# Patient Record
Sex: Female | Born: 1970 | Race: Black or African American | Hispanic: No | Marital: Single | Smoking: Current every day smoker
Health system: Southern US, Community
[De-identification: ages and names within clinical notes are randomized; demographics above are authoritative.]

## PROBLEM LIST (undated history)

## (undated) DIAGNOSIS — M1990 Unspecified osteoarthritis, unspecified site: Secondary | ICD-10-CM

## (undated) DIAGNOSIS — I10 Essential (primary) hypertension: Secondary | ICD-10-CM

## (undated) HISTORY — PX: ABDOMINAL HYSTERECTOMY: SHX81

## (undated) HISTORY — PX: CHOLECYSTECTOMY: SHX55

## (undated) HISTORY — PX: BREAST SURGERY: SHX581

---

## 2015-10-11 ENCOUNTER — Emergency Department (HOSPITAL_COMMUNITY)
Admission: EM | Admit: 2015-10-11 | Discharge: 2015-10-11 | Disposition: A | Discharge status: Home / Self Care | Payer: Self-pay | Attending: Emergency Medicine | Admitting: Emergency Medicine

## 2015-10-11 ENCOUNTER — Emergency Department (HOSPITAL_COMMUNITY): Payer: Self-pay

## 2015-10-11 ENCOUNTER — Encounter (HOSPITAL_COMMUNITY): Payer: Self-pay | Admitting: Family Medicine

## 2015-10-11 DIAGNOSIS — I10 Essential (primary) hypertension: Secondary | ICD-10-CM | POA: Insufficient documentation

## 2015-10-11 DIAGNOSIS — M25561 Pain in right knee: Secondary | ICD-10-CM | POA: Insufficient documentation

## 2015-10-11 DIAGNOSIS — Z79899 Other long term (current) drug therapy: Secondary | ICD-10-CM | POA: Insufficient documentation

## 2015-10-11 DIAGNOSIS — F17200 Nicotine dependence, unspecified, uncomplicated: Secondary | ICD-10-CM | POA: Insufficient documentation

## 2015-10-11 HISTORY — DX: Essential (primary) hypertension: I10

## 2015-10-11 LAB — I-STAT CHEM 8, ED
BUN: 5 mg/dL — AB (ref 6–20)
CALCIUM ION: 1.18 mmol/L (ref 1.12–1.23)
CHLORIDE: 102 mmol/L (ref 101–111)
CREATININE: 0.8 mg/dL (ref 0.44–1.00)
Glucose, Bld: 91 mg/dL (ref 65–99)
HCT: 45 % (ref 36.0–46.0)
Hemoglobin: 15.3 g/dL — ABNORMAL HIGH (ref 12.0–15.0)
Potassium: 3.9 mmol/L (ref 3.5–5.1)
SODIUM: 141 mmol/L (ref 135–145)
TCO2: 28 mmol/L (ref 0–100)

## 2015-10-11 MED ORDER — HYDROCODONE-ACETAMINOPHEN 5-325 MG PO TABS
1.0000 | ORAL_TABLET | Freq: Once | ORAL | Status: AC
  Administered 2015-10-11: 1 via ORAL
  Filled 2015-10-11: qty 1

## 2015-10-11 MED ORDER — HYDROCODONE-ACETAMINOPHEN 5-325 MG PO TABS: 2.0000 | ORAL_TABLET | ORAL | Status: DC | PRN

## 2015-10-11 MED ORDER — HYDROCHLOROTHIAZIDE 12.5 MG PO TABS: 12.5000 mg | ORAL_TABLET | Freq: Every day | ORAL | Status: DC

## 2015-10-11 MED ORDER — IBUPROFEN 600 MG PO TABS: 600.0000 mg | ORAL_TABLET | Freq: Four times a day (QID) | ORAL | Status: DC | PRN

## 2015-10-11 NOTE — ED Provider Notes (Signed)
CSN: [id number]     Arrival date & time 10/11/15  0847 History  By signing my name below, I, Evon Slack, attest that this documentation has been prepared under the direction and in the presence of [employer], [place]. Electronically Signed: Evon Slack, ED Scribe. 10/11/2015. 9:45 AM.      Chief Complaint  Patient presents with  . Knee Pain    The history is provided by the patient. No language interpreter was used.   HPI Comments: Brandy Holmes is a 45 y.o. female who presents to the Emergency Department complaining of constant worsening sharp right knee pain onset 1 week prior. Reports associated swelling. She states that the pain intermittently radiates up into her thigh. She states that the pain is worse when bearing weight and ambulating. Pt denies taking any medications PTA. Pt denies recent injury or trauma to the knee.  Denies fever or numbness. Pt does report that she has Hx of HTN and has not been compliant with her medications for the last 2 years. Denies HA , light headedness, dizziness, change in vision, CP, SOB, numbness, tingling, weakness.  Past Medical History  Diagnosis Date  . Hypertension    Past Surgical History  Procedure Laterality Date  . Abdominal hysterectomy    . Breast surgery    . Cholecystectomy     History reviewed. No pertinent family history. Social History  Substance Use Topics  . Smoking status: Current Every Day Smoker  . Smokeless tobacco: None  . Alcohol Use: No   OB History    No data available      Review of Systems  Eyes: Negative for visual disturbance.  Musculoskeletal: Positive for joint swelling and arthralgias.  Neurological: Negative for dizziness, light-headedness, numbness and headaches.     Allergies  Review of patient's allergies indicates no known allergies.  Home Medications   Prior to Admission medications   Medication Sig Start Date End Date Taking? Authorizing Provider  hydrochlorothiazide  (HYDRODIURIL) 12.5 MG tablet Take 1 tablet (12.5 mg total) by mouth daily. 10/11/15   Barrett Henle, PA-C  HYDROcodone-acetaminophen (NORCO/VICODIN) 5-325 MG tablet Take 2 tablets by mouth every 4 (four) hours as needed. 10/11/15   Barrett Henle, PA-C  ibuprofen (ADVIL,MOTRIN) 600 MG tablet Take 1 tablet (600 mg total) by mouth every 6 (six) hours as needed. 10/11/15   Satira Sark Meena Barrantes, PA-C   BP 133/69 mmHg  Pulse 50  Temp(Src) 98.3 F (36.8 C) (Oral)  Resp 16  SpO2 100%  LMP 10/11/2015   Physical Exam  Constitutional: She is oriented to person, place, and time. She appears well-developed and well-nourished.  HENT:  Head: Normocephalic and atraumatic.  Eyes: Conjunctivae and EOM are normal. Right eye exhibits no discharge. Left eye exhibits no discharge. No scleral icterus.  Neck: Normal range of motion. Neck supple.  Cardiovascular: Normal rate, regular rhythm and normal heart sounds.   Pulmonary/Chest: Effort normal. No respiratory distress. She has no wheezes. She has no rales. She exhibits no tenderness.  Abdominal: Soft. She exhibits no distension.  Musculoskeletal: She exhibits tenderness.       Right knee: She exhibits no swelling, no effusion, no ecchymosis, no deformity, no laceration, no erythema, normal alignment, no LCL laxity, normal patellar mobility and no MCL laxity. Decreased range of motion: due to pain. Tenderness found. Medial joint line, lateral joint line and patellar tendon tenderness noted.  Right knee with mild TTP, decreased active ROM due to pain, no MCL ACL PCL  laxity, 2+ PT pulse, sensation grossly intact, no swelling erythema or warmth noted.    Neurological: She is alert and oriented to person, place, and time.  Skin: Skin is warm and dry.  Nursing note and vitals reviewed.   ED Course  Procedures (including critical care time) DIAGNOSTIC STUDIES: Oxygen Saturation is 100% on RA, normal by my interpretation.    COORDINATION OF  CARE: 9:42 AM-Discussed treatment plan with pt at bedside and pt agreed to plan.     Labs Review Labs Reviewed  I-STAT CHEM 8, ED - Abnormal; Notable for the following:    BUN 5 (*)    Hemoglobin 15.3 (*)    All other components within normal limits    Imaging Review Dg Knee Complete 4 Views Right  10/11/2015  CLINICAL DATA:  Pain and swelling for 1 week. Twisting injury 2 weeks prior EXAM: RIGHT KNEE - COMPLETE 4+ VIEW COMPARISON:  None. FINDINGS: Frontal, lateral, and bilateral oblique views were obtained. There is no fracture or dislocation. There is no appreciable joint effusion. There is narrowing of the patellofemoral joint. Other joint spaces appear normal. No erosive change. There are spurs along the anterior patella. IMPRESSION: Narrowing patellofemoral joint. No fracture or dislocation. No joint effusion. Anterior patellar spurs are likely due to distal quadriceps and proximal patellar tendinosis. Electronically Signed   By: Bretta BangWilliam  Woodruff III M.D.   On: 10/11/2015 09:27   I have personally reviewed and evaluated these images as part of my medical decision-making.   EKG Interpretation None      MDM   Final diagnoses:  Right knee pain    Patient presents with right knee pain for the past week, worse with bearing weight or ambulating. Denies any recent fall, trauma, injury. Denies fever. BP 190/90, remaining vitals stable. Patient endorses history of hypertension but notes she has not been taking medications for the past 2 years. Exam revealed mild tenderness to right knee with decreased active range of motion due to pain. Exam otherwise unremarkable, right lower extremity neurovascularly intact. Patient given pain meds in the ED. Right knee x-ray revealed narrowing patellofemoral joint, anterior patellar spurs, no fracture or dislocation or joint effusion. I suspect patient's symptoms are likely due to arthritic changes seen on x-ray. On reevaluation, patient reports her  pain has improved. Repeat vitals revealed BP 197/87. Patient denies headache, visual changes, lightheadedness, dizziness, shortness of breath, chest pain, abdominal pain, numbness, tingling, weakness. No signs of hypertensive emergency or urgency at this time. Due to patient's elevated blood pressure, will check basic labs and start patient on HCTZ with PCP follow-up. Labs unremarkable. Plan to discharge patient home with pain meds and low-dose HCTZ. Discussed results and plan for discharge with patient. Advised patient to follow up with a PCP within the next week for follow-up of her right knee pain and further management of her hypertension. Discussed return precautions with patient.  I personally performed the services described in this documentation, which was scribed in my presence. The recorded information has been reviewed and is accurate.      Satira Sarkicole Elizabeth Jamest[place]Nadeau, [place]-C 10/11/15 1129  Pricilla LovelessScott Goldston, MD 10/17/15 [phone]

## 2015-10-11 NOTE — Discharge Instructions (Signed)
Take your medications as prescribed as needed for pain relief. I recommend resting, elevating and applying ice to her knee for 15-20 minutes 3-4 times daily to help with pain and swelling. Please follow up with a primary care provider from the Resource Guide provided below in 1 week if your symptoms have not improved. I also recommend following up with a primary care provider within the next week regarding reevaluation of your blood pressure and further management of your elevated blood pressure. Please return to the Emergency Department if symptoms worsen or new onset of fever, redness, swelling, warmth, numbness, tingling, weakness, chest pain, shortness of breath, headache, visual changes, lightheadedness, dizziness.

## 2015-10-11 NOTE — ED Notes (Signed)
Pt is in stable condition upon d/c and ambulates from ED. Pt started on HCTZ rt HTN, pt hasn't been taking her prescribed lisinopril in years. Pt verbalizes understanding about medication and s/s of HTN.

## 2015-10-11 NOTE — ED Notes (Signed)
Pt here for right knee pain x 1 week and throbbing. denies injury. sts hx of same in knee.

## 2015-10-11 NOTE — ED Notes (Signed)
Patient transported to X-ray 

## 2015-10-14 ENCOUNTER — Emergency Department (HOSPITAL_COMMUNITY)
Admission: EM | Admit: 2015-10-14 | Discharge: 2015-10-14 | Disposition: A | Discharge status: Home / Self Care | Payer: Self-pay | Attending: Emergency Medicine | Admitting: Emergency Medicine

## 2015-10-14 ENCOUNTER — Encounter (HOSPITAL_COMMUNITY): Payer: Self-pay | Admitting: Emergency Medicine

## 2015-10-14 DIAGNOSIS — I10 Essential (primary) hypertension: Secondary | ICD-10-CM | POA: Insufficient documentation

## 2015-10-14 DIAGNOSIS — F17200 Nicotine dependence, unspecified, uncomplicated: Secondary | ICD-10-CM | POA: Insufficient documentation

## 2015-10-14 DIAGNOSIS — R0600 Dyspnea, unspecified: Secondary | ICD-10-CM | POA: Insufficient documentation

## 2015-10-14 DIAGNOSIS — M25561 Pain in right knee: Secondary | ICD-10-CM | POA: Insufficient documentation

## 2015-10-14 NOTE — Discharge Instructions (Signed)
Immobilizer for comfort. Since having trouble with the price of the prescriptions would recommend over-the-counter Aleve Naprosyn 500 mg twice a day for the next 7 days. Would be important if you could at least afford the hydrochlorothiazide prescription. Follow-up with orthopedics. Work note provided.

## 2015-10-14 NOTE — ED Notes (Signed)
Pt arrives from home with cc of R knee pain. Pt says she was seen Wednesday for same, has been resting and icing leg and wasn't able to get Rx's at d/c due to cost. Pt was at bus stop yesterday, stepped down on sidewalk and heard knee pop. C/o "12/10" pain currently, worse with bending.

## 2015-10-14 NOTE — ED Provider Notes (Signed)
CSN: [id number]     Arrival date & time 10/14/15  0919 History   First MD Initiated Contact with Patient 10/14/15 [id number]     Chief Complaint  Patient presents with  . Knee Pain  . Leg Pain     (Consider location/radiation/quality/duration/timing/severity/associated sxs/prior Treatment) Patient is a 45 y.o. female presenting with knee pain and leg pain. The history is provided by the patient.  Knee Pain Associated symptoms: no fever   Leg Pain Associated symptoms: no fever   Patient with two-week history of knee pain. Patient's had some trouble with the knee on and off in the past. Has had popping sensation followed by pain. Patient seen June 14 for same x-rays without any acute bony injuries with some suggestion of some arthritic knee changes. Patient unable to get any prescriptions filled due to cost. Patient returns for same complaint seen on June 14. No new injury. Patient still with persistent right knee pain particularly with bending. Patient states knee pain is 10 out of 10.  Past Medical History  Diagnosis Date  . Hypertension    Past Surgical History  Procedure Laterality Date  . Abdominal hysterectomy    . Breast surgery    . Cholecystectomy     No family history on file. Social History  Substance Use Topics  . Smoking status: Current Every Day Smoker  . Smokeless tobacco: None  . Alcohol Use: No   OB History    No data available     Review of Systems  Constitutional: Negative for fever.  Respiratory: Negative for shortness of breath.   Cardiovascular: Negative for chest pain.  Gastrointestinal: Negative for abdominal pain.  Musculoskeletal: Positive for joint swelling.  Skin: Negative for rash and wound.  Neurological: Positive for weakness. Negative for numbness and headaches.  Hematological: Does not bruise/bleed easily.  Psychiatric/Behavioral: Negative for confusion.      Allergies  Review of patient's allergies indicates no known allergies.  Home  Medications   Prior to Admission medications   Medication Sig Start Date End Date Taking? Authorizing Provider  hydrochlorothiazide (HYDRODIURIL) 12.5 MG tablet Take 1 tablet (12.5 mg total) by mouth daily. 10/11/15   Barrett HenleNicole Elizabeth Nadeau, PA-C  HYDROcodone-acetaminophen (NORCO/VICODIN) 5-325 MG tablet Take 2 tablets by mouth every 4 (four) hours as needed. 10/11/15   Barrett HenleNicole Elizabeth Nadeau, PA-C  ibuprofen (ADVIL,MOTRIN) 600 MG tablet Take 1 tablet (600 mg total) by mouth every 6 (six) hours as needed. 10/11/15   Satira SarkNicole Elizabeth Nadeau, PA-C   BP 207/63 mmHg  Pulse 47  SpO2 100%  LMP 10/11/2015 Physical Exam  Constitutional: She is oriented to person, place, and time. She appears well-developed and well-nourished. No distress.  HENT:  Head: Normocephalic and atraumatic.  Mouth/Throat: Oropharynx is clear and moist.  Eyes: Conjunctivae and EOM are normal. Pupils are equal, round, and reactive to light.  Neck: Normal range of motion. Neck supple.  Cardiovascular: Normal rate, regular rhythm and normal heart sounds.   No murmur heard. Pulmonary/Chest: Breath sounds normal. She is in respiratory distress.  Abdominal: Soft. Bowel sounds are normal. There is no tenderness.  Musculoskeletal: She exhibits edema.  Slight swelling to right knee suggestive of mild effusion. Patella is not dislocated. No joint line tenderness. No locking but decreased range of motion associated with pain. Neurovascularly intact distally. No calf tenderness. No erythema. No increased warmth.  Neurological: She is alert and oriented to person, place, and time. No cranial nerve deficit. She exhibits normal muscle tone. Coordination normal.  Skin: Skin is warm. No rash noted.  Nursing note and vitals reviewed.   ED Course  Procedures (including critical care time) Labs Review Labs Reviewed - No data to display  Imaging Review No results found. I have personally reviewed and evaluated these images and lab  results as part of my medical decision-making.   EKG Interpretation None      MDM   Final diagnoses:  Knee pain, acute, right    Patient with two-week complaint of right knee pain. Experience some popping in his had pain since then. Patient seen on June 14 x-rays without any bony injuries but some arthritic type changes. Patient's exam today shows suggestion of a slight effusion to the right knee compared to left. Neurovascularly intact. Will treat with knee immobilizer orthopedic referral. Patient did not get any of her prescriptions filled due to cost. Would recommend over-the-counter Naprosyn and also recommend she try to get the hydrochlorothiazide filled for the high blood pressure. Patient's work note extended.     Vanetta Mulders, MD 10/14/15 1011

## 2016-03-19 ENCOUNTER — Encounter: Payer: Self-pay | Admitting: Pediatric Intensive Care

## 2016-03-26 ENCOUNTER — Encounter: Payer: Self-pay | Admitting: Pediatric Intensive Care

## 2016-04-04 NOTE — Congregational Nurse Program (Signed)
Congregational Nurse Program Note  Date of Encounter: 03/19/2016  Past Medical History: Past Medical History:  Diagnosis Date  . Hypertension     Encounter Details:     CNP Questionnaire - 03/19/16 0830      Patient Demographics   Is this a new or existing patient? New   Patient is considered a/an Not Applicable   Race African-American/Black     Patient Assistance   Location of Patient Assistance [facility]   Patient's financial/insurance status Self-Pay (Uninsured)   Uninsured Patient ([employer] officer, trade unionCard/Care Connects) Yes   Interventions Not Applicable   Patient referred to apply for the following financial assistance [facility]al   Food insecurities addressed Not Applicable   Transportation assistance No   Assistance securing medications No   Product/process development scientistducational health offerings Navigating the healthcare system;Hypertension     Encounter Details   Primary purpose of visit Navigating the Healthcare System;Education/Health Concerns   Was an Emergency Department visit averted? Not Applicable   Does patient have a medical provider? No   Patient referred to Establish PCP   Was a mental health screening completed? (GAINS tool) No   Does patient have dental issues? No   Does patient have vision issues? No   Does your patient have an abnormal blood pressure today? No   Since previous encounter, have you referred patient for abnormal blood pressure that resulted in a new diagnosis or medication change? No   Does your patient have an abnormal blood glucose today? No   Since previous encounter, have you referred patient for abnormal blood glucose that resulted in a new diagnosis or medication change? No   Was there a life-saving intervention made? No     Client new to [facility]. She had Medicaid previously in another state. Reports history of hypertension, left knee and shoulder arthtritis and pain issues. No medications at present but has taken gabapentin, naproxen and amlodipine in  the past. CN [place] and [facility]. Client states she will [facility]

## 2016-04-06 NOTE — Congregational Nurse Program (Signed)
Congregational Nurse Program Note  Date of Encounter: 03/26/2016  Past Medical History: Past Medical History:  Diagnosis Date  . Hypertension     Encounter Details:     CNP Questionnaire - 03/26/16 1000      Patient Demographics   Is this a new or existing patient? Existing   Patient is considered a/an Not Applicable   Race African-American/Black     Patient Assistance   Location of Patient Assistance [facility]   Patient's financial/insurance status Self-Pay (Uninsured)   Uninsured Patient ([employer] Card/Care Connects) Yes   Interventions Counseled to make appt. with provider;Assisted patient in making appt.   Patient referred to apply for the following financial assistance Medicaid;[employer] Card/Care Connects   Food insecurities addressed Not Applicable   Transportation assistance No   Assistance securing medications No   Educational health offerings Hypertension;Navigating the healthcare system     Encounter Details   Primary purpose of visit Navigating the Healthcare System;Education/Health Concerns   Was an Emergency Department visit averted? Not Applicable   Does patient have a medical provider? No   Patient referred to Establish PCP;Clinic   Was a mental health screening completed? (GAINS tool) No   Does patient have dental issues? No   Does patient have vision issues? No   Does your patient have an abnormal blood pressure today? No   Since previous encounter, have you referred patient for abnormal blood pressure that resulted in a new diagnosis or medication change? No   Does your patient have an abnormal blood glucose today? No   Since previous encounter, have you referred patient for abnormal blood glucose that resulted in a new diagnosis or medication change? No   Was there a life-saving intervention made? No      BP check. Client [facility] due to [facility]. CN attempted to contact client's old provider in [place] but they were  unable to renew prescription for BP meds. CN will continue to assist client in making PCP appointment.

## 2016-04-08 ENCOUNTER — Encounter (HOSPITAL_COMMUNITY): Payer: Self-pay

## 2016-04-08 ENCOUNTER — Emergency Department (HOSPITAL_COMMUNITY)
Admission: EM | Admit: 2016-04-08 | Discharge: 2016-04-08 | Disposition: A | Discharge status: Home / Self Care | Payer: Self-pay | Attending: Emergency Medicine | Admitting: Emergency Medicine

## 2016-04-08 DIAGNOSIS — G8929 Other chronic pain: Secondary | ICD-10-CM | POA: Insufficient documentation

## 2016-04-08 DIAGNOSIS — F17200 Nicotine dependence, unspecified, uncomplicated: Secondary | ICD-10-CM | POA: Insufficient documentation

## 2016-04-08 DIAGNOSIS — I10 Essential (primary) hypertension: Secondary | ICD-10-CM | POA: Insufficient documentation

## 2016-04-08 DIAGNOSIS — M25561 Pain in right knee: Secondary | ICD-10-CM | POA: Insufficient documentation

## 2016-04-08 DIAGNOSIS — Z79899 Other long term (current) drug therapy: Secondary | ICD-10-CM | POA: Insufficient documentation

## 2016-04-08 MED ORDER — TRAMADOL-ACETAMINOPHEN 37.5-325 MG PO TABS: 1.0000 | ORAL_TABLET | Freq: Four times a day (QID) | ORAL | 0 refills | Status: DC | PRN

## 2016-04-08 NOTE — ED Provider Notes (Signed)
[facility]EMERGENCY DEPT Provider Note   CSN: [id number] Arrival date & time: 04/08/16  1451  By signing my name below, I, Brandy Holmes, attest that this documentation has been prepared under the direction and in the presence of Terance HartKelly Amarilis Belflower, PA-C. Electronically Signed: Javier Dockerobert Ryan Halas, ER Scribe. 12/09/2015. 3:49 PM.  History   Chief Complaint Chief Complaint  Patient presents with  . Joint Swelling   HPI  HPI Comments: Brandy SilvanDesiree Holmes is a 45 y.o. female who presents to the Emergency Department complaining of right knee pain and swelling, worse with walking or standing for extended periods. She has a past hx of several injuries to the knee over the past 20 years. Going up stairs hurts more than going down stairs. She states her right knee occasionally pops and gives out on her when she walks. She has not seen an orthopedic doctor in the past. She has taken ibuprofen and aleve with temporary relief. She is taking 600mg  twice per day.    Past Medical History:  Diagnosis Date  . Hypertension     There are no active problems to display for this patient.   Past Surgical History:  Procedure Laterality Date  . ABDOMINAL HYSTERECTOMY    . BREAST SURGERY    . CHOLECYSTECTOMY      OB History    No data available       Home Medications    Prior to Admission medications   Medication Sig Start Date End Date Taking? Authorizing Provider  hydrochlorothiazide (HYDRODIURIL) 12.5 MG tablet Take 1 tablet (12.5 mg total) by mouth daily. 10/11/15   Barrett HenleNicole Elizabeth Nadeau, PA-C  HYDROcodone-acetaminophen (NORCO/VICODIN) 5-325 MG tablet Take 2 tablets by mouth every 4 (four) hours as needed. 10/11/15   Barrett HenleNicole Elizabeth Nadeau, PA-C  ibuprofen (ADVIL,MOTRIN) 600 MG tablet Take 1 tablet (600 mg total) by mouth every 6 (six) hours as needed. 10/11/15   Barrett HenleNicole Elizabeth Nadeau, PA-C    Family History No family history on file.  Social History Social History  Substance Use Topics  . Smoking  status: Current Every Day Smoker  . Smokeless tobacco: Never Used  . Alcohol use No     Allergies   Patient has no known allergies.   Review of Systems Review of Systems  Constitutional: Negative for chills and fever.  Musculoskeletal: Positive for arthralgias and gait problem.  Skin: Negative for color change and wound.  Neurological: Positive for weakness. Negative for numbness.   Physical Exam Updated Vital Signs BP 160/79 (BP Location: Right Arm)   Pulse 72   Temp 98 F (36.7 C) (Oral)   Resp 18   Ht 5\' 5"  (1.651 m)   Wt 230 lb (104.3 kg)   SpO2 99%   BMI 38.27 kg/m   Physical Exam  Constitutional: She is oriented to person, place, and time. She appears well-developed and well-nourished. No distress.  HENT:  Head: Normocephalic and atraumatic.  Eyes: Pupils are equal, round, and reactive to light.  Neck: Neck supple.  Cardiovascular: Normal rate.   Pulmonary/Chest: Effort normal. No respiratory distress.  Musculoskeletal: Normal range of motion.  Right knee: No obvious swelling or deformity. Tenderness to palpation along medial joint line. Decreased ROM. N/V intact.   Neurological: She is alert and oriented to person, place, and time. Coordination normal.  Skin: Skin is warm and dry. She is not diaphoretic.  Psychiatric: She has a normal mood and affect. Her behavior is normal.  Nursing note and vitals reviewed.  ED Treatments / Results  Labs (all labs ordered are listed, but only abnormal results are displayed) Labs Reviewed - No data to display  EKG  EKG Interpretation None       Radiology No results found.  Procedures Procedures (including critical care time)  Medications Ordered in ED Medications - No data to display   Initial Impression / Assessment and Plan / ED Course  I have reviewed the triage vital signs and the nursing notes.  Pertinent labs & imaging results that were available during my care of the patient were reviewed by me  and considered in my medical decision making (see chart for details).  Clinical Course    Pt present with aching pain & joint stiffness worsened in the morning. On exam joints were tender with limited ROM, but no current medical concern for septic arthritis or joint as pt is afebrile & without warmth of the affected area. Previous xrays reviewed and are consistent with OA. She may have underlying soft tissue injury as well with recurrent popping and her leg giving out. Pt advised to follow up with orthopedics for further management. Return precautions discussed. Patient will be dc home & is agreeable with above plan.  Final Clinical Impressions(s) / ED Diagnoses   Final diagnoses:  Chronic pain of right knee    New Prescriptions Discharge Medication List as of 04/08/2016  4:05 PM    START taking these medications   Details  traMADol-acetaminophen (ULTRACET) 37.5-325 MG tablet Take 1 tablet by mouth every 6 (six) hours as needed., Starting Mon 04/08/2016, Print        I personally performed the services described in this documentation, which was scribed in my presence. The recorded information has been reviewed and is accurate.       Bethel BornKelly Marie Kaylah Chiasson, PA-C 04/13/16 1107    Loren Raceravid Yelverton, MD 04/13/16 [phone]

## 2016-04-08 NOTE — Discharge Instructions (Signed)
Rest - please stay off knee as much as possible Ice - ice for 20 minutes at a time, several times a day Compression - wear brace to provide support Elevate - elevate knee above level of heart Ibuprofen - take with food. Take up to 3-4 times daily Follow up with Orthopedics for definitive care

## 2016-04-08 NOTE — ED Notes (Signed)
Declined W/C at D/C and was escorted to lobby by RN. 

## 2016-04-08 NOTE — ED Notes (Signed)
Patient currently changing into gown.

## 2016-04-08 NOTE — ED Triage Notes (Signed)
Per Pt, Pt is coming from home with complaints of recurrent right knee burning with edema that started again this morning. Pt is able to walk, but reports knee "giving out" at times. Hx of "popping" and swelling since June.

## 2016-04-11 ENCOUNTER — Ambulatory Visit (INDEPENDENT_AMBULATORY_CARE_PROVIDER_SITE_OTHER): Payer: Self-pay | Admitting: Orthopedic Surgery

## 2016-04-11 ENCOUNTER — Ambulatory Visit (INDEPENDENT_AMBULATORY_CARE_PROVIDER_SITE_OTHER): Payer: Self-pay

## 2016-04-11 VITALS — Ht 65.0 in | Wt 230.0 lb

## 2016-04-11 DIAGNOSIS — G8929 Other chronic pain: Secondary | ICD-10-CM

## 2016-04-11 DIAGNOSIS — M25561 Pain in right knee: Secondary | ICD-10-CM

## 2016-04-11 DIAGNOSIS — M1711 Unilateral primary osteoarthritis, right knee: Secondary | ICD-10-CM

## 2016-04-11 MED ORDER — LIDOCAINE HCL 1 % IJ SOLN
5.0000 mL | INTRAMUSCULAR | Status: AC | PRN
  Administered 2016-04-11: 5 mL

## 2016-04-11 MED ORDER — METHYLPREDNISOLONE ACETATE 40 MG/ML IJ SUSP
40.0000 mg | INTRAMUSCULAR | Status: AC | PRN
  Administered 2016-04-11: 40 mg via INTRA_ARTICULAR
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
## 2016-04-16 ENCOUNTER — Encounter: Payer: Self-pay | Admitting: Pediatric Intensive Care

## 2016-04-24 NOTE — Congregational Nurse Program (Signed)
Congregational Nurse Program Note  Date of Encounter: 04/10/2016  Past Medical History: Past Medical History:  Diagnosis Date  . Hypertension     Encounter Details:     CNP Questionnaire - 04/10/16 1225      Patient Demographics   Is this a new or existing patient? New   Patient is considered a/an Not Applicable   Race Caucasian/White     Patient Assistance   Location of Patient Assistance Not Applicable   Patient's financial/insurance status Low Income;Medicaid   Uninsured Patient ([employer], trade unionCard/Care Connects) No   Interventions Not Applicable   Patient referred to apply for the following financial assistance Not Applicable   Food insecurities addressed Not Applicable   Transportation assistance Yes   Type of Assistance Bus Pass Given   Assistance securing medications No   Educational health offerings Chronic disease;Medications;Navigating the healthcare system     Encounter Details   Primary purpose of visit Education/Health Concerns;Chronic Illness/Condition Visit;Navigating the Healthcare System   Was an Emergency Department visit averted? Not Applicable   Does patient have a medical provider? Yes   Patient referred to Not Applicable   Was a mental health screening completed? (GAINS tool) No   Does patient have dental issues? No   Does patient have vision issues? No   Does your patient have an abnormal blood pressure today? No   Since previous encounter, have you referred patient for abnormal blood pressure that resulted in a new diagnosis or medication change? No   Does your patient have an abnormal blood glucose today? No   Since previous encounter, have you referred patient for abnormal blood glucose that resulted in a new diagnosis or medication change? No   Was there a life-saving intervention made? No     Requested assistance with medication fill from Ed.  Client has medicare.  Discussed with client that medicare would pay for scripts with a low co-pay.  Client  states she was unaware of that and could pay her co-pay of $3 or less.  Bus passes provided to go to the pharmacy to fill scripts

## 2016-04-30 ENCOUNTER — Encounter: Payer: Self-pay | Admitting: Pediatric Intensive Care

## 2016-04-30 ENCOUNTER — Emergency Department (HOSPITAL_COMMUNITY)
Admission: EM | Admit: 2016-04-30 | Discharge: 2016-04-30 | Disposition: A | Discharge status: Home / Self Care | Payer: Medicaid Other | Attending: Emergency Medicine | Admitting: Emergency Medicine

## 2016-04-30 ENCOUNTER — Encounter (HOSPITAL_COMMUNITY): Payer: Self-pay | Admitting: Emergency Medicine

## 2016-04-30 DIAGNOSIS — F17200 Nicotine dependence, unspecified, uncomplicated: Secondary | ICD-10-CM | POA: Insufficient documentation

## 2016-04-30 DIAGNOSIS — I10 Essential (primary) hypertension: Secondary | ICD-10-CM | POA: Insufficient documentation

## 2016-04-30 DIAGNOSIS — R0981 Nasal congestion: Secondary | ICD-10-CM

## 2016-04-30 MED ORDER — AMLODIPINE BESYLATE 2.5 MG PO TABS: 2.5000 mg | ORAL_TABLET | Freq: Every day | ORAL | 0 refills | Status: DC

## 2016-04-30 MED ORDER — AMLODIPINE BESYLATE 5 MG PO TABS
2.5000 mg | ORAL_TABLET | Freq: Once | ORAL | Status: AC
  Administered 2016-04-30: 2.5 mg via ORAL
  Filled 2016-04-30: qty 1

## 2016-04-30 MED FILL — AMLODIPINE BESYLATE 2.5 MG: 2.5 | 30 days supply | Qty: 30 | Fill #0

## 2016-04-30 NOTE — ED Notes (Signed)
Bed: WA13 Expected date:  Expected time:  Means of arrival:  Comments: 

## 2016-04-30 NOTE — ED Provider Notes (Signed)
[facility]-EMERGENCY DEPT Provider Note   CSN: [id number] Arrival date & time: 04/30/16  0854     History   Chief Complaint Chief Complaint  Patient presents with  . Hypertension    HPI Brandy Holmes is a 46 y.o. female with a PMHx of HTN and PSHx of hysterectomy and cholecystectomy, who presents to the ED sent in by the nursing staff at the shelter for evaluation and treatment of her hypertension. Patient states that she hasn't been on any hypertension medications in 2 years, was previously trialed on lisinopril which was not effective, lisinopril-HCTZ which caused her to have excessive urination at night, and ultimately amlodipine 2.5 mg daily which was effective and she tolerated it well. She hasn't had this medication in 2 years due to financial issues and not having a primary care doctor. The nursing staff at the shelter have been keeping an eye on her blood pressure and it has gradually elevated, so she was sent here to get refills of her blood pressure medications. Chart review reveals that on 10/11/15 she was in the emergency room for knee pain, her blood pressure was noted to be elevated, she was sent home with HCTZ 12.5 mg which she states she never filled due to financial concerns and the fact that she knows that medication will cause her to urinate more. She denies any complaints or concerns related to her hypertension today, although she does admit that occasionally she gets lightheaded and has palpitations when her blood pressure spikes. She currently has none of these symptoms.  Additionally she reports 1 week of sinus congestion, white rhinorrhea, and b/l ear pressure which worsens with being outside and with no tx tried PTA. She has flonase from a prior prescription but hasn't used it. Usually tries tylenol decongestants but hasn't taken these recently. She is working on getting PCP care at [facility], reports she's going to call them this afternoon.   She denies fevers, chills, headache,  vision changes, ongoing lightheadedness, cough, sore throat, ear drainage, leg swelling, CP, SOB, abd pain, N/V/D/C, hematuria, dysuria, myalgias, arthralgias, numbness, tingling, weakness, or any other complaints at this time.    The history is provided by the patient and medical records. No language interpreter was used.  Hypertension  This is a chronic problem. The current episode started more than 1 week ago. The problem occurs constantly. The problem has been gradually worsening. Pertinent negatives include no chest pain, no abdominal pain, no headaches and no shortness of breath. The symptoms are aggravated by stress. Nothing relieves the symptoms. She has tried nothing for the symptoms. The treatment provided no relief.    Past Medical History:  Diagnosis Date  . Hypertension     There are no active problems to display for this patient.   Past Surgical History:  Procedure Laterality Date  . ABDOMINAL HYSTERECTOMY    . BREAST SURGERY    . CHOLECYSTECTOMY      OB History    No data available       Home Medications    Prior to Admission medications   Medication Sig Start Date End Date Taking? Authorizing Provider  hydrochlorothiazide (HYDRODIURIL) 12.5 MG tablet Take 1 tablet (12.5 mg total) by mouth daily. 10/11/15   Barrett Henle, PA-C  HYDROcodone-acetaminophen (NORCO/VICODIN) 5-325 MG tablet Take 2 tablets by mouth every 4 (four) hours as needed. 10/11/15   Barrett Henle, PA-C  ibuprofen (ADVIL,MOTRIN) 600 MG tablet Take 1 tablet (600 mg total) by mouth every 6 (six)  hours as needed. 10/11/15   Barrett Henle, PA-C  traMADol-acetaminophen (ULTRACET) 37.5-325 MG tablet Take 1 tablet by mouth every 6 (six) hours as needed. 04/08/16   Bethel Born, PA-C    Family History No family history on file.  Social History Social History  Substance Use Topics  . Smoking status: Current Every Day Smoker  . Smokeless tobacco: Never Used  .  Alcohol use No     Allergies   Patient has no known allergies.   Review of Systems Review of Systems  Constitutional: Negative for chills and fever.  HENT: Positive for congestion, ear pain (pressure) and rhinorrhea. Negative for ear discharge and sore throat.   Eyes: Negative for visual disturbance.  Respiratory: Negative for cough and shortness of breath.   Cardiovascular: Negative for chest pain and leg swelling.  Gastrointestinal: Negative for abdominal pain, constipation, diarrhea, nausea and vomiting.  Genitourinary: Negative for dysuria, hematuria, vaginal bleeding and vaginal discharge.  Musculoskeletal: Negative for arthralgias and myalgias.  Skin: Negative for color change.  Allergic/Immunologic: Negative for immunocompromised state.  Neurological: Negative for weakness, light-headedness, numbness and headaches.  Psychiatric/Behavioral: Negative for confusion.   10 Systems reviewed and are negative for acute change except as noted in the HPI.   Physical Exam Updated Vital Signs BP 198/85 (BP Location: Left Arm)   Pulse 68   Temp 98.3 F (36.8 C) (Oral)   Resp 18   SpO2 100%   Physical Exam  Constitutional: She is oriented to person, place, and time. Vital signs are normal. She appears well-developed and well-nourished.  Non-toxic appearance. No distress.  Afebrile, nontoxic, NAD, HTN 198/95 noted, slightly higher than prior visits  HENT:  Head: Normocephalic and atraumatic.  Right Ear: Hearing, external ear and ear canal normal. Tympanic membrane is not erythematous and not bulging. A middle ear effusion (serous) is present.  Left Ear: Hearing, external ear and ear canal normal. Tympanic membrane is not erythematous and not bulging. A middle ear effusion (serous) is present.  Nose: Mucosal edema and rhinorrhea present. Right sinus exhibits maxillary sinus tenderness. Left sinus exhibits maxillary sinus tenderness.  Mouth/Throat: Uvula is midline, oropharynx is  clear and moist and mucous membranes are normal. No trismus in the jaw. No uvula swelling. Tonsils are 0 on the right. Tonsils are 0 on the left. No tonsillar exudate.  Ears with mild serous effusions bilaterally but otherwise clear bilaterally, TMs without erythema or bulging, good cone of light and landmarks maintained. Nose with mild mucosal edema and clear rhinorrhea, mild maxillary sinus TTP bilaterally. Oropharynx clear and moist, without uvular swelling or deviation, no trismus or drooling, no tonsillar swelling or erythema, no exudates.   Eyes: Conjunctivae and EOM are normal. Right eye exhibits no discharge. Left eye exhibits no discharge.  Neck: Normal range of motion. Neck supple.  Cardiovascular: Normal rate, regular rhythm, normal heart sounds and intact distal pulses.  Exam reveals no gallop and no friction rub.   No murmur heard. Pulmonary/Chest: Effort normal and breath sounds normal. No respiratory distress. She has no decreased breath sounds. She has no wheezes. She has no rhonchi. She has no rales.  Abdominal: Soft. Normal appearance and bowel sounds are normal. She exhibits no distension. There is no tenderness. There is no rigidity, no rebound, no guarding, no CVA tenderness, no tenderness at McBurney's point and negative Murphy's sign.  Musculoskeletal: Normal range of motion.  Neurological: She is alert and oriented to person, place, and time. She has normal strength.  No sensory deficit.  Skin: Skin is warm, dry and intact. No rash noted.  Psychiatric: She has a normal mood and affect.  Nursing note and vitals reviewed.    ED Treatments / Results  Labs (all labs ordered are listed, but only abnormal results are displayed) Labs Reviewed - No data to display  EKG  EKG Interpretation None       Radiology No results found.  Procedures Procedures (including critical care time)  Medications Ordered in ED Medications  amLODipine (NORVASC) tablet 2.5 mg (2.5 mg Oral  Given 04/30/16 [id number])     Initial Impression / Assessment and Plan / ED Course  I have reviewed the triage vital signs and the nursing notes.  Pertinent labs & imaging results that were available during my care of the patient were reviewed by me and considered in my medical decision making (see chart for details).  Clinical Course     46 y.o. female here with concern for HTN. She hasn't been on anything in 2 years for her BP, previously on lisinopril (not effective) then lisinopril-HCTZ (caused her to urinate a lot and she didn't like it), then on amlodipine 2.5mg  daily which was effective and she tolerated it well. Has been having BP monitored by nurse at shelter, and today it's been higher so she was sent here to get BP meds restarted. Also reports 1wk of sinus congestion/rhinorrhea/ear pressure. Mild nasal congestion and clear rhinorrhea noted. Denies other complaints at this time, no s/sx of HTN urgency/emergency, doubt need for further emergent work up at this time. Will restart amlodipine ($8.75 at [facility], vs HCTZ $4 at [facility]-- pt still prefers Amlodipine and states she can afford it). Discussed use of flonase and netipot and coricidin for her congestion, likely viral vs allergic vs HTN related; avoid other OTC decongestants. DASH diet encouraged. She's working on getting PCP care at [facility], advised to f/up with them to establish care and for ongoing management of her HTN. I explained the diagnosis and have given explicit precautions to return to the ER including for any other new or worsening symptoms. The patient understands and accepts the medical plan as it's been dictated and I have answered their questions. Discharge instructions concerning home care and prescriptions have been given. The patient is STABLE and is discharged to home in good condition.   Final Clinical Impressions(s) / ED Diagnoses   Final diagnoses:  Essential hypertension  Sinus congestion    New  Prescriptions New Prescriptions   AMLODIPINE (NORVASC) 2.5 MG TABLET    Take 1 tablet (2.5 mg total) by mouth daily.     Allen DerryMercedes Camprubi-Soms, PA-C 04/30/16 1013    Tilden FossaElizabeth Rees, MD 04/30/16 [phone]

## 2016-04-30 NOTE — Discharge Instructions (Signed)
Continue to stay well-hydrated. Continue to alternate between Tylenol and Ibuprofen for pain or fever. Use Mucinex for cough suppression/expectoration of mucus. Use netipot and flonase to help with nasal congestion. May use CORICIDIN for your sinus congestion, but avoid other over the counter decongestants as this may increase your blood pressure. You may consider over-the-counter Benadryl or other antihistamine to decrease secretions and for help with your symptoms. Eat a low salt diet, take your blood pressure medication as directed. Follow up with the primary care providers at [facility] in 5-7 days for recheck of ongoing symptoms and to establish medical care. Return to emergency department for emergent changing or worsening of symptoms.

## 2016-04-30 NOTE — ED Triage Notes (Signed)
Per pt, states HTN for a few days-states the nurse at shelter has been taking it-states she hasn't been on meds for awhile

## 2016-04-30 NOTE — Congregational Nurse Program (Signed)
Congregational Nurse Program Note  Date of Encounter: 04/30/2016  Past Medical History: Past Medical History:  Diagnosis Date  . Hypertension     Encounter Details:     CNP Questionnaire - 04/30/16 0836      Patient Demographics   Is this a new or existing patient? Existing   Patient is considered a/an Not Applicable   Race African-American/Black     Patient Assistance   Location of Patient Assistance [place]   Patient's financial/insurance status Low Income;Medicaid   Uninsured Patient ([employer], trade unionCard/Care Connects) No   Interventions Referred to ED/Urgent Care   Patient referred to apply for the following financial assistance Not Applicable   Food insecurities addressed Not Applicable   Transportation assistance Yes   Type of Assistance Bus Pass Given   Assistance securing medications No   Educational health offerings Hypertension     Encounter Details   Primary purpose of visit Acute Illness/Condition Visit;Education/Health Concerns   Was an Emergency Department visit averted? No   Does patient have a medical provider? No   Patient referred to Emergency Department   Was a mental health screening completed? (GAINS tool) No   Does patient have dental issues? No   Does patient have vision issues? No   Does your patient have an abnormal blood pressure today? Yes   Since previous encounter, have you referred patient for abnormal blood pressure that resulted in a new diagnosis or medication change? No   Does your patient have an abnormal blood glucose today? No   Since previous encounter, have you referred patient for abnormal blood glucose that resulted in a new diagnosis or medication change? No   Was there a life-saving intervention made? No     Client states that she doesn't feel well and that she knows her blood pressure is high. Client last smoked a cigarette about 0700. Client denies headache, floaters and chest pain but states that she feels like her heart is "erratic".  Heart regular 84-88 but BPs are elevated over client baseline. Buss passes given so client can go to ED for evaluation. Client to notify Cn if she needs prescriptions filled post-ED visit.

## 2016-05-03 ENCOUNTER — Ambulatory Visit (HOSPITAL_COMMUNITY)
Admission: EM | Admit: 2016-05-03 | Discharge: 2016-05-03 | Disposition: A | Discharge status: Home / Self Care | Payer: Medicaid Other | Attending: Emergency Medicine | Admitting: Emergency Medicine

## 2016-05-03 ENCOUNTER — Encounter: Payer: Self-pay | Admitting: Pediatric Intensive Care

## 2016-05-03 ENCOUNTER — Encounter (HOSPITAL_COMMUNITY): Payer: Self-pay | Admitting: Family Medicine

## 2016-05-03 DIAGNOSIS — M545 Low back pain, unspecified: Secondary | ICD-10-CM

## 2016-05-03 DIAGNOSIS — I159 Secondary hypertension, unspecified: Secondary | ICD-10-CM

## 2016-05-03 LAB — POCT I-STAT, CHEM 8
BUN: 8 mg/dL (ref 6–20)
CREATININE: 0.7 mg/dL (ref 0.44–1.00)
Calcium, Ion: 1.21 mmol/L (ref 1.15–1.40)
Chloride: 103 mmol/L (ref 101–111)
Glucose, Bld: 83 mg/dL (ref 65–99)
HCT: 40 % (ref 36.0–46.0)
Hemoglobin: 13.6 g/dL (ref 12.0–15.0)
Potassium: 3.7 mmol/L (ref 3.5–5.1)
Sodium: 139 mmol/L (ref 135–145)
TCO2: 26 mmol/L (ref 0–100)

## 2016-05-03 MED ORDER — IBUPROFEN 800 MG PO TABS: 800.0000 mg | ORAL_TABLET | Freq: Three times a day (TID) | ORAL | 0 refills | Status: DC

## 2016-05-03 MED ORDER — TRAMADOL HCL 50 MG PO TABS: ORAL_TABLET | ORAL | 0 refills | Status: DC

## 2016-05-03 MED ORDER — CYCLOBENZAPRINE HCL 10 MG PO TABS: 10.0000 mg | ORAL_TABLET | Freq: Three times a day (TID) | ORAL | 0 refills | Status: DC

## 2016-05-03 NOTE — ED Provider Notes (Signed)
HPI  SUBJECTIVE:  Brandy Holmes is a 46 y.o. female who presents from the [facility] for elevated blood pressure. Patient states that her blood pressure measured 198/100 there. Patient was restarted on her amlodipine 2.5 mg daily by the ED 2 days ago, the patient states that she is taking it. She denies any headaches, dysarthria, discoordination, arm or leg weakness, facial droop, chest pain, shortness of breath, abdominal pain, syncope, hematuria, anuria, lower extremity edema. She denies any recent decongestions or cocaine use. She also reports dull, achy, intermittent right-sided back pain for the past 2 days. States it is present depending on activity and that it is identical to previous back spasm/pain. She states that she had one episode of pain radiating down her leg, but none since. She states that she has been doing a lot of heavy lifting. No abdominal pain, fevers, syncope, urinary complaints, trauma to her back. No fecal or urinary retention, saddle anesthesia, lower extremity weakness. No coughing, wheezing, shortness of breath or nausea. She tried Naprosyn 2 tabs and stretching without improvement in her symptoms. Symptoms are worse with lateral bending. She has a past medical history of back pain, hypertension, cholecystectomy, UTIs. No history of nephrolithiasis, pyelonephritis, kidney disease, GI bleed, HIV, cancer, IVDU, AAA. She states that she is trying to arrange follow-up at [facility]. She also states the Flexeril has worked very well for her in the past.   She was seen in the ED on 1/2 for HTN. Per chart, Lisinopril was ineffective, lisinopril hydrochlorothiazide causes her to urinate a lot and patient did not like this. Amlodipine 2.5 mg daily was effective and she tolerated it well. She was given a prescription of this and advised to have DASH diet.   Past Medical History:  Diagnosis Date  . Hypertension     Past Surgical History:  Procedure Laterality Date  .  ABDOMINAL HYSTERECTOMY    . BREAST SURGERY    . CHOLECYSTECTOMY      History reviewed. No pertinent family history.  Social History  Substance Use Topics  . Smoking status: Current Every Day Smoker  . Smokeless tobacco: Never Used  . Alcohol use No    No current facility-administered medications for this encounter.   Current Outpatient Prescriptions:  .  amLODipine (NORVASC) 2.5 MG tablet, Take 1 tablet (2.5 mg total) by mouth daily., Disp: 30 tablet, Rfl: 0 .  cyclobenzaprine (FLEXERIL) 10 MG tablet, Take 1 tablet (10 mg total) by mouth 3 (three) times daily., Disp: 30 tablet, Rfl: 0 .  ibuprofen (ADVIL,MOTRIN) 800 MG tablet, Take 1 tablet (800 mg total) by mouth 3 (three) times daily., Disp: 30 tablet, Rfl: 0 .  traMADol (ULTRAM) 50 MG tablet, 1 tab po every 6 hours as needed Maximum dose= 8 tablets per day, Disp: 20 tablet, Rfl: 0  No Known Allergies   ROS  As noted in HPI.   Physical Exam  BP 159/69   Pulse (!) 52   Temp 98 F (36.7 C)   Resp 18   LMP 10/11/2015   SpO2 98%  BP Readings from Last 3 Encounters:  05/03/16 159/69  05/03/16 (!) 192/102  04/30/16 150/84   Constitutional: Well developed, well nourished, no acute distress Eyes:  EOMI, conjunctiva normal bilaterally HENT: Normocephalic, atraumatic,mucus membranes moist Respiratory: Normal inspiratory effort, , lungs clear bilaterally. Good air movement Cardiovascular: Regular rhythm, no murmurs, rubs, gallops GI: nondistended soft, nontender, active bowel sounds. No suprapubic or flank tenderness Back: No CVA tenderness skin:  No rash, skin intact Musculoskeletal: + R paralumbar tenderness, +  muscle spasm. + bony tenderness at L5/S1 but pt states that this is not new she states that this is been present since she had an epidural. Bilateral lower extremities NT, baseline ROM with intact PT pulses,  No pain with active or passive int/ext rotation flex/extension hips bilaterally. SLR neg bilaterally.  Sensation baseline light touch bilaterally for Pt, DTR's symmetric and intact bilaterally KJ. Motor symmetric bilateral 5/5 hip flexion, quadriceps, hamstrings, EHL, foot dorsiflexion, foot plantarflexion, gait normal.  Neurologic: Alert & oriented x 3, no focal neuro deficits Psychiatric: Speech and behavior appropriate   ED Course   Medications - No data to display  Orders Placed This Encounter  Procedures  . I-STAT, chem 8    Standing Status:   Standing    Number of Occurrences:   1    Results for orders placed or performed during the hospital encounter of 05/03/16 (from the past 24 hour(s))  I-STAT, chem 8     Status: None   Collection Time: 05/03/16  1:53 PM  Result Value Ref Range   Sodium 139 135 - 145 mmol/L   Potassium 3.7 3.5 - 5.1 mmol/L   Chloride 103 101 - 111 mmol/L   BUN 8 6 - 20 mg/dL   Creatinine, Ser 1.19 0.44 - 1.00 mg/dL   Glucose, Bld 83 65 - 99 mg/dL   Calcium, Ion 1.47 8.29 - 1.40 mmol/L   TCO2 26 0 - 100 mmol/L   Hemoglobin 13.6 12.0 - 15.0 g/dL   HCT 56.2 13.0 - 86.5 %   No results found.  ED Clinical Impression  Secondary hypertension  Acute right-sided low back pain without sciatica   ED Assessment/Plan  Previous records reviewed as noted in history of present illness.  [facility] narcotic database reviewed. Patient was prescribed a 3 day supply of tramadol #10 filled on 12/13.  I-STAT normal. No evidence of kidney disease or damage.  Initial BP was high, repeat BP more acceptable. She is otherwise having no complaints other than the back pain. Her back pain seems to be musculoskeletal in origin, we'll check kidney function prior to initiating NSAID treatment, but plan to send home with ibuprofen 800 mg 3 times a day with 1 g of Tylenol, Flexeril and tramadol. She will follow-up with her primary care physician at [facility] as soon as possible. She is to go to the ED if she gets worse or for any signs of hypertensive emergency.  Plan as  above.  Discussed labs, MDM, plan and followup with patient. Discussed sn/sx that should prompt return to the ED. Patient  agrees with plan.   Meds ordered this encounter  Medications  . traMADol (ULTRAM) 50 MG tablet    Sig: 1 tab po every 6 hours as needed Maximum dose= 8 tablets per day    Dispense:  20 tablet    Refill:  0  . ibuprofen (ADVIL,MOTRIN) 800 MG tablet    Sig: Take 1 tablet (800 mg total) by mouth 3 (three) times daily.    Dispense:  30 tablet    Refill:  0  . cyclobenzaprine (FLEXERIL) 10 MG tablet    Sig: Take 1 tablet (10 mg total) by mouth 3 (three) times daily.    Dispense:  30 tablet    Refill:  0    *This clinic note was created using Scientist, clinical (histocompatibility and immunogenetics). Therefore, there may be occasional mistakes despite careful proofreading.  ?  Domenick GongAshley Rudie Rikard, MD 05/03/16 1425

## 2016-05-03 NOTE — Congregational Nurse Program (Signed)
Congregational Nurse Program Note  Date of Encounter: 05/03/2016  Past Medical History: Past Medical History:  Diagnosis Date  . Hypertension     Encounter Details:     CNP Questionnaire - 05/03/16 [id number]      Patient Demographics   Is this a new or existing patient? Existing   Patient is considered a/an Not Applicable   Race African-American/Black     Patient Assistance   Location of Patient Assistance [facility]   Patient's financial/insurance status Medicaid   Uninsured Patient ([place] [facility]/[employer] Connects) No   Interventions Referred to ED/Urgent Care   Patient referred to apply for the following financial assistance Not Applicable   Food insecurities addressed Not Applicable   Transportation assistance Yes   Type of Assistance Bus Pass Given   Assistance securing medications No   Educational health offerings Hypertension     Encounter Details   Primary purpose of visit Acute Illness/Condition Visit   Was an Emergency Department visit averted? Yes   Does patient have a medical provider? Yes   Patient referred to Urgent Care   Was a mental health screening completed? (GAINS tool) No   Does patient have dental issues? No   Does patient have vision issues? No   Does your patient have an abnormal blood pressure today? Yes   Since previous encounter, have you referred patient for abnormal blood pressure that resulted in a new diagnosis or medication change? Yes   Does your patient have an abnormal blood glucose today? No   Since previous encounter, have you referred patient for abnormal blood glucose that resulted in a new diagnosis or medication change? No   Was there a life-saving intervention made? No     Clinet in for BP check after starting medication. Denies headache, floaters but states she's having back pain. BPs per flowsheet. Recommended client go to Urgent Care for BP assessment. Will follow up with CN for medication changes/BP re-check.

## 2016-05-03 NOTE — Discharge Instructions (Signed)
.   1 g of Tylenol with 800 mg of ibuprofen 3 times a day. Tramadol for severe pain only. Try some gentle stretching and deep tissue massage.   Decrease your salt intake. diet and exercise will lower your blood pressure significantly. It is important to keep your blood pressure under good control, as having a elevated for prolonged periods of time significantly increases your risk of stroke, heart attacks, kidney damage, eye damage, and other problems. Return here in a week for blood pressure recheck if you're able to find a primary care physician by then. Return immediately to the ER if you start having chest pain, headache, problems seeing, problems talking, problems walking, if you feel like you're about to pass out, if you do pass out, if you have a seizure, or for any other concerns..Marland Kitchen

## 2016-05-03 NOTE — ED Triage Notes (Addendum)
Pt here for increased BP. sts that is was 198/102. sts she recently started back on her BP meds. sts also some back cramping. Was seen this am by the congregational nurse.

## 2016-05-04 NOTE — Congregational Nurse Program (Signed)
Congregational Nurse Program Note  Date of Encounter: 04/16/2016  Past Medical History: Past Medical History:  Diagnosis Date  . Hypertension     Encounter Details:     CNP Questionnaire - 05/03/16 [id number]      Patient Demographics   Is this a new or existing patient? Existing   Patient is considered a/an Not Applicable   Race African-American/Black     Patient Assistance   Location of Patient Assistance [facility]   Patient's financial/insurance status Medicaid   Uninsured Patient ([place] [facility]/Care Connects) No   Interventions Referred to ED/Urgent Care   Patient referred to apply for the following financial assistance Not Applicable   Food insecurities addressed Not Applicable   Transportation assistance Yes   Type of Assistance Bus Pass Given   Assistance securing medications No   Educational health offerings Hypertension     Encounter Details   Primary purpose of visit Acute Illness/Condition Visit   Was an Emergency Department visit averted? Yes   Does patient have a medical provider? Yes   Patient referred to Urgent Care   Was a mental health screening completed? (GAINS tool) No   Does patient have dental issues? No   Does patient have vision issues? No   Does your patient have an abnormal blood pressure today? Yes   Since previous encounter, have you referred patient for abnormal blood pressure that resulted in a new diagnosis or medication change? Yes   Does your patient have an abnormal blood glucose today? No   Since previous encounter, have you referred patient for abnormal blood glucose that resulted in a new diagnosis or medication change? No   Was there a life-saving intervention made? No     BP check- client had ortho visit on 12/14 for cortisone shot in knee. BP continues elevated. Client has not established PCP for hypertension evaluation. Cleint states that she will go as soon as possible. Follow up for BP in CN clinic as needed.

## 2016-05-06 ENCOUNTER — Encounter: Payer: Self-pay | Admitting: Pediatric Intensive Care

## 2016-05-06 MED FILL — traMADol HCL 50 MG TABS: 50 | 7 days supply | Qty: 20 | Fill #0

## 2016-05-06 MED FILL — CYCLOBENZAPRINE 10 MG TAB: 10 | 10 days supply | Qty: 30 | Fill #0

## 2016-05-06 MED FILL — IBUPROFEN 800 MG TABLET: 800 | 10 days supply | Qty: 30 | Fill #0

## 2016-05-07 ENCOUNTER — Encounter: Payer: Self-pay | Admitting: Pediatric Intensive Care

## 2016-05-09 ENCOUNTER — Ambulatory Visit (INDEPENDENT_AMBULATORY_CARE_PROVIDER_SITE_OTHER): Payer: Medicaid Other | Admitting: Orthopedic Surgery

## 2016-05-12 NOTE — Congregational Nurse Program (Signed)
Congregational Nurse Program Note  Date of Encounter: 05/03/2016  Past Medical History: Past Medical History:  Diagnosis Date  . Hypertension     Encounter Details:     CNP Questionnaire - 05/03/16 [id number]      Patient Demographics   Is this a new or existing patient? Existing   Patient is considered a/an Not Applicable   Race African-American/Black     Patient Assistance   Location of Patient Assistance [facility]   Patient's financial/insurance status Medicaid   Uninsured Patient ([place] Card/Care Connects) No   Interventions Referred to ED/Urgent Care   Patient referred to apply for the following financial assistance Not Applicable   Food insecurities addressed Not Applicable   Transportation assistance Yes   Type of Assistance Bus Pass Given   Assistance securing medications No   Educational health offerings Hypertension     Encounter Details   Primary purpose of visit Acute Illness/Condition Visit   Was an Emergency Department visit averted? Yes   Does patient have a medical provider? Yes   Patient referred to Urgent Care   Was a mental health screening completed? (GAINS tool) No   Does patient have dental issues? No   Does patient have vision issues? No   Does your patient have an abnormal blood pressure today? Yes   Since previous encounter, have you referred patient for abnormal blood pressure that resulted in a new diagnosis or medication change? Yes   Does your patient have an abnormal blood glucose today? No   Since previous encounter, have you referred patient for abnormal blood glucose that resulted in a new diagnosis or medication change? No   Was there a life-saving intervention made? No     requested b/p check  160/90.  Has an appointment with primary provider soon.

## 2016-05-17 ENCOUNTER — Encounter: Payer: Self-pay | Admitting: Pediatric Intensive Care

## 2016-06-06 ENCOUNTER — Encounter (HOSPITAL_COMMUNITY): Payer: Self-pay

## 2016-06-06 ENCOUNTER — Emergency Department (HOSPITAL_COMMUNITY): Payer: Medicaid Other

## 2016-06-06 ENCOUNTER — Emergency Department (HOSPITAL_COMMUNITY)
Admission: EM | Admit: 2016-06-06 | Discharge: 2016-06-06 | Disposition: A | Discharge status: Home / Self Care | Payer: Medicaid Other | Attending: Emergency Medicine | Admitting: Emergency Medicine

## 2016-06-06 DIAGNOSIS — Z79899 Other long term (current) drug therapy: Secondary | ICD-10-CM | POA: Insufficient documentation

## 2016-06-06 DIAGNOSIS — F17200 Nicotine dependence, unspecified, uncomplicated: Secondary | ICD-10-CM | POA: Insufficient documentation

## 2016-06-06 DIAGNOSIS — G40909 Epilepsy, unspecified, not intractable, without status epilepticus: Secondary | ICD-10-CM | POA: Insufficient documentation

## 2016-06-06 DIAGNOSIS — R0600 Dyspnea, unspecified: Secondary | ICD-10-CM

## 2016-06-06 DIAGNOSIS — Z8673 Personal history of transient ischemic attack (TIA), and cerebral infarction without residual deficits: Secondary | ICD-10-CM | POA: Insufficient documentation

## 2016-06-06 LAB — CBC
HCT: 38.9 % (ref 36.0–46.0)
Hemoglobin: 12.9 g/dL (ref 12.0–15.0)
MCH: 31.9 pg (ref 26.0–34.0)
MCHC: 33.2 g/dL (ref 30.0–36.0)
MCV: 96.3 fL (ref 78.0–100.0)
Platelets: 272 10*3/uL (ref 150–400)
RBC: 4.04 MIL/uL (ref 3.87–5.11)
RDW: 13.4 % (ref 11.5–15.5)
WBC: 8.2 10*3/uL (ref 4.0–10.5)

## 2016-06-06 LAB — BASIC METABOLIC PANEL
Anion gap: 9 (ref 5–15)
BUN: 6 mg/dL (ref 6–20)
CO2: 26 mmol/L (ref 22–32)
Calcium: 9.2 mg/dL (ref 8.9–10.3)
Chloride: 104 mmol/L (ref 101–111)
Creatinine, Ser: 0.83 mg/dL (ref 0.44–1.00)
GFR calc Af Amer: >60 mL/min (ref >60)
GFR calc non Af Amer: >60 mL/min (ref >60)
Glucose, Bld: 91 mg/dL (ref 65–99)
Potassium: 3.9 mmol/L (ref 3.5–5.1)
Sodium: 139 mmol/L (ref 135–145)

## 2016-06-06 LAB — I-STAT TROPONIN, ED: Troponin i, poc: 0 ng/mL (ref 0.00–0.08)

## 2016-06-06 MED ORDER — HYDRALAZINE HCL 25 MG PO TABS: 25.0000 mg | ORAL_TABLET | Freq: Three times a day (TID) | ORAL | 2 refills | Status: DC

## 2016-06-06 NOTE — ED Provider Notes (Signed)
[facility]-EMERGENCY DEPT Provider Note   CSN: [id number] Arrival date & time: 06/06/16  1304  By signing my name below, I, Brandy Holmes, attest that this documentation has been prepared under the direction and in the presence of Brandy RazorStephen Castiel Lauricella, MD. Electronically Signed: Sonum Holmes, Neurosurgeoncribe. 06/06/16. 2:22 PM.  History   Chief Complaint Chief Complaint  Patient presents with  . Chest Pain   The history is provided by the patient. No language interpreter was used.     HPI Comments: Brandy SilvanDesiree Fatzinger is a 46 y.o. female with past medical history of HTN who presents to the Emergency Department complaining of an episode of SOB that occurred 3 hours ago while sitting in a class. She states the episode has since resolved. She states her blood pressure was 210/100s at that time. She used to take amlodipine 2.5mg  but has been off of this medication due to financial reasons. She reports this is an intermittent problem for her that usually resolves spontaneously. She sometimes has associated CP but denies this with today's episode. She states this occurs about twice a month but attributes it to anxiety or stress. She denies taking birth control. She denies history of CA or blood clots.   Past Medical History:  Diagnosis Date  . Hypertension     There are no active problems to display for this patient.   Past Surgical History:  Procedure Laterality Date  . ABDOMINAL HYSTERECTOMY    . BREAST SURGERY    . CHOLECYSTECTOMY      OB History    No data available       Home Medications    Prior to Admission medications   Medication Sig Start Date End Date Taking? Authorizing Provider  amLODipine (NORVASC) 2.5 MG tablet Take 1 tablet (2.5 mg total) by mouth daily. 04/30/16  Yes Mercedes Street, PA-C  cyclobenzaprine (FLEXERIL) 10 MG tablet Take 1 tablet (10 mg total) by mouth 3 (three) times daily. Patient not taking: Reported on 06/06/2016 05/03/16   Domenick GongAshley Mortenson, MD  ibuprofen (ADVIL,MOTRIN) 800 MG  tablet Take 1 tablet (800 mg total) by mouth 3 (three) times daily. Patient not taking: Reported on 06/06/2016 05/03/16   Domenick GongAshley Mortenson, MD  traMADol Janean Sark(ULTRAM) 50 MG tablet 1 tab po every 6 hours as needed Maximum dose= 8 tablets per day Patient not taking: Reported on 06/06/2016 05/03/16   Domenick GongAshley Mortenson, MD    Family History No family history on file.  Social History Social History  Substance Use Topics  . Smoking status: Current Every Day Smoker  . Smokeless tobacco: Never Used  . Alcohol use No     Allergies   Patient has no known allergies.   Review of Systems Review of Systems  A complete 10 system review of systems was obtained and all systems are negative except as noted in the HPI and PMH.    Physical Exam Updated Vital Signs BP 135/77   Pulse (!) 49   Temp 98.5 F (36.9 C) (Oral)   Resp 21   LMP 10/11/2015   SpO2 100%   Physical Exam  Constitutional: She is oriented to person, place, and time. She appears well-developed and well-nourished. No distress.  HENT:  Head: Normocephalic and atraumatic.  Eyes: EOM are normal.  Neck: Normal range of motion.  Cardiovascular: Normal rate, regular rhythm and normal heart sounds.   Pulmonary/Chest: Effort normal and breath sounds normal.  Abdominal: Soft. She exhibits no distension. There is no tenderness.  Musculoskeletal: Normal range of motion.  Neurological: She is alert and oriented to person, place, and time.  Skin: Skin is warm and dry.  Psychiatric: She has a normal mood and affect. Judgment normal.  Nursing note and vitals reviewed.    ED Treatments / Results  DIAGNOSTIC STUDIES: Oxygen Saturation is 100% on RA, normal by my interpretation.    COORDINATION OF CARE: 2:01 PM Discussed treatment plan with pt at bedside and pt agreed to plan.   Labs (all labs ordered are listed, but only abnormal results are displayed) Labs Reviewed  CBC  BASIC METABOLIC PANEL  I-STAT TROPOININ, ED    EKG  EKG  Interpretation None       Radiology No results found.  Procedures Procedures (including critical care time)  Medications Ordered in ED Medications - No data to display   Initial Impression / Assessment and Plan / ED Course  I have reviewed the triage vital signs and the nursing notes.  Pertinent labs & imaging results that were available during my care of the patient were reviewed by me and considered in my medical decision making (see chart for details).      Final Clinical Impressions(s) / ED Diagnoses   Final diagnoses:  Dyspnea, unspecified type    New Prescriptions New Prescriptions   No medications on file   I personally preformed the services scribed in my presence. The recorded information has been reviewed is accurate. Brandy Razor, MD.    Brandy Razor, MD 06/19/16 [phone]

## 2016-06-06 NOTE — ED Triage Notes (Signed)
CO of CP she describes as chest tightness and feels like she is unable to catch her breath for the last 2 hours. She is out of her amlodipine and EMS administered 2nitro and 324 ASA. HA that is now an 8/10

## 2016-06-10 ENCOUNTER — Encounter (HOSPITAL_COMMUNITY): Payer: Self-pay | Admitting: Emergency Medicine

## 2016-06-10 ENCOUNTER — Emergency Department (HOSPITAL_COMMUNITY)
Admission: EM | Admit: 2016-06-10 | Discharge: 2016-06-10 | Disposition: A | Discharge status: Home / Self Care | Payer: Self-pay | Attending: Emergency Medicine | Admitting: Emergency Medicine

## 2016-06-10 ENCOUNTER — Emergency Department (HOSPITAL_COMMUNITY): Payer: Self-pay

## 2016-06-10 DIAGNOSIS — Z79899 Other long term (current) drug therapy: Secondary | ICD-10-CM | POA: Insufficient documentation

## 2016-06-10 DIAGNOSIS — I10 Essential (primary) hypertension: Secondary | ICD-10-CM | POA: Insufficient documentation

## 2016-06-10 DIAGNOSIS — F17200 Nicotine dependence, unspecified, uncomplicated: Secondary | ICD-10-CM | POA: Insufficient documentation

## 2016-06-10 DIAGNOSIS — R1031 Right lower quadrant pain: Secondary | ICD-10-CM | POA: Insufficient documentation

## 2016-06-10 LAB — URINALYSIS, ROUTINE W REFLEX MICROSCOPIC
BILIRUBIN URINE: NEGATIVE
GLUCOSE, UA: NEGATIVE mg/dL
Hgb urine dipstick: NEGATIVE
KETONES UR: 20 mg/dL — AB
LEUKOCYTES UA: NEGATIVE
Nitrite: NEGATIVE
PROTEIN: NEGATIVE mg/dL
Specific Gravity, Urine: 1.024 (ref 1.005–1.030)
pH: 5 (ref 5.0–8.0)

## 2016-06-10 LAB — COMPREHENSIVE METABOLIC PANEL
ALT: 14 U/L (ref 14–54)
AST: 17 U/L (ref 15–41)
Albumin: 3.9 g/dL (ref 3.5–5.0)
Alkaline Phosphatase: 55 U/L (ref 38–126)
Anion gap: 8 (ref 5–15)
BILIRUBIN TOTAL: 1.2 mg/dL (ref 0.3–1.2)
BUN: 8 mg/dL (ref 6–20)
CO2: 25 mmol/L (ref 22–32)
CREATININE: 0.74 mg/dL (ref 0.44–1.00)
Calcium: 9.4 mg/dL (ref 8.9–10.3)
Chloride: 104 mmol/L (ref 101–111)
GFR calc Af Amer: >60 mL/min (ref >60)
GFR calc non Af Amer: >60 mL/min (ref >60)
Glucose, Bld: 89 mg/dL (ref 65–99)
Potassium: 4.1 mmol/L (ref 3.5–5.1)
Sodium: 137 mmol/L (ref 135–145)
TOTAL PROTEIN: 6.8 g/dL (ref 6.5–8.1)

## 2016-06-10 LAB — CBC
HCT: 40.2 % (ref 36.0–46.0)
Hemoglobin: 13.4 g/dL (ref 12.0–15.0)
MCH: 31.9 pg (ref 26.0–34.0)
MCHC: 33.3 g/dL (ref 30.0–36.0)
MCV: 95.7 fL (ref 78.0–100.0)
PLATELETS: 283 10*3/uL (ref 150–400)
RBC: 4.2 MIL/uL (ref 3.87–5.11)
RDW: 13.7 % (ref 11.5–15.5)
WBC: 7.9 10*3/uL (ref 4.0–10.5)

## 2016-06-10 LAB — LIPASE, BLOOD: Lipase: 22 U/L (ref 11–51)

## 2016-06-10 MED ORDER — IOPAMIDOL (ISOVUE-300) INJECTION 61%
INTRAVENOUS | Status: AC
  Administered 2016-06-10: 100 mL
  Filled 2016-06-10: qty 100

## 2016-06-10 NOTE — ED Triage Notes (Signed)
Per EMS: pt from home c/o RLQ pain starting last night; pt noted to have htn

## 2016-06-10 NOTE — ED Provider Notes (Signed)
[facility] DEPT Provider Note   CSN: [id number] Arrival date & time: 06/10/16  1237     History   Chief Complaint Chief Complaint  Patient presents with  . Abdominal Pain    HPI Brandy Holmes is a 46 y.o. female.  HPI Patient presents with concern of present quadrant abdominal pain. Onset was in the past 24 hours, since onset the pain is been more severe, and is now throbbing, persistent. Pain is nonradiating. There is associated anorexia, nausea, but no vomiting. Patient also has loose stool, this is unchanged from baseline. She has a history of prior cholecystectomy, partial hysterectomy. Since onset no relief with OTC medication.  No other new changes, including fever, chills, chest pain, dyspnea.    Past Medical History:  Diagnosis Date  . Hypertension     There are no active problems to display for this patient.   Past Surgical History:  Procedure Laterality Date  . ABDOMINAL HYSTERECTOMY    . BREAST SURGERY    . CHOLECYSTECTOMY      OB History    No data available       Home Medications    Prior to Admission medications   Medication Sig Start Date End Date Taking? Authorizing Provider  naproxen sodium (ALEVE) 220 MG tablet Take 220-440 mg by mouth 2 (two) times daily as needed (pain).   Yes Historical Provider, MD  amLODipine (NORVASC) 2.5 MG tablet Take 1 tablet (2.5 mg total) by mouth daily. Patient not taking: Reported on 06/10/2016 04/30/16   Mercedes Street, PA-C  cyclobenzaprine (FLEXERIL) 10 MG tablet Take 1 tablet (10 mg total) by mouth 3 (three) times daily. Patient not taking: Reported on 06/06/2016 05/03/16   Domenick Gong, MD  hydrALAZINE (APRESOLINE) 25 MG tablet Take 1 tablet (25 mg total) by mouth 3 (three) times daily. Patient not taking: Reported on 06/10/2016 06/06/16   Raeford Razor, MD  ibuprofen (ADVIL,MOTRIN) 800 MG tablet Take 1 tablet (800 mg total) by mouth 3 (three) times daily. Patient not taking: Reported on 06/06/2016 05/03/16    Domenick Gong, MD  traMADol Janean Sark) 50 MG tablet 1 tab po every 6 hours as needed Maximum dose= 8 tablets per day Patient not taking: Reported on 06/06/2016 05/03/16   Domenick Gong, MD    Family History History reviewed. No pertinent family history.  Social History Social History  Substance Use Topics  . Smoking status: Current Every Day Smoker  . Smokeless tobacco: Never Used  . Alcohol use No     Allergies   Patient has no known allergies.   Review of Systems Review of Systems  Constitutional:       Per HPI, otherwise negative  HENT:       Per HPI, otherwise negative  Respiratory:       Per HPI, otherwise negative  Cardiovascular:       Per HPI, otherwise negative  Gastrointestinal: Positive for abdominal pain and nausea. Negative for vomiting.  Endocrine:       Negative aside from HPI  Genitourinary:       Neg aside from HPI   Musculoskeletal:       Per HPI, otherwise negative  Skin: Negative.   Neurological: Negative for syncope.     Physical Exam Updated Vital Signs BP (!) 132/50 (BP Location: Right Arm)   Pulse 62   Temp 98.4 F (36.9 C) (Oral)   Resp 22   Ht 5\' 5"  (1.651 m)   Wt 233 lb (105.7 kg)  LMP 10/11/2015   SpO2 99%   BMI 38.77 kg/m   Physical Exam  Constitutional: She is oriented to person, place, and time. She appears well-developed and well-nourished. No distress.  HENT:  Head: Normocephalic and atraumatic.  Eyes: Conjunctivae and EOM are normal.  Cardiovascular: Normal rate and regular rhythm.   Pulmonary/Chest: Effort normal and breath sounds normal. No stridor. No respiratory distress.  Abdominal: She exhibits no distension. There is tenderness. There is tenderness at McBurney's point.  Musculoskeletal: She exhibits no edema.  Neurological: She is alert and oriented to person, place, and time. No cranial nerve deficit.  Skin: Skin is warm and dry.  Psychiatric: She has a normal mood and affect.  Nursing note and vitals  reviewed.    ED Treatments / Results  Labs (all labs ordered are listed, but only abnormal results are displayed) Labs Reviewed  URINALYSIS, ROUTINE W REFLEX MICROSCOPIC - Abnormal; Notable for the following:       Result Value   Ketones, ur 20 (*)    All other components within normal limits  LIPASE, BLOOD  COMPREHENSIVE METABOLIC PANEL  CBC     Radiology Ct Abdomen Pelvis W Contrast  Result Date: 06/10/2016 CLINICAL DATA:  Acute onset of right lower quadrant abdominal pain and nausea. Initial encounter. EXAM: CT ABDOMEN AND PELVIS WITH CONTRAST TECHNIQUE: Multidetector CT imaging of the abdomen and pelvis was performed using the standard protocol following bolus administration of intravenous contrast. CONTRAST:  ISOVUE-300 IOPAMIDOL (ISOVUE-300) INJECTION 61% COMPARISON:  None. FINDINGS: Lower chest: The visualized lung bases are grossly clear. The visualized portions of the mediastinum are unremarkable. Hepatobiliary: The liver is unremarkable in appearance. The patient is status post cholecystectomy, with clips noted at the gallbladder fossa. The common bile duct remains normal in caliber. Pancreas: The pancreas is within normal limits. Spleen: The spleen is unremarkable in appearance. Adrenals/Urinary Tract: The adrenal glands are unremarkable in appearance. The kidneys are within normal limits. There is no evidence of hydronephrosis. No renal or ureteral stones are identified, though evaluation for stones is limited given contrast in the renal calyces. No perinephric stranding is seen. Stomach/Bowel: The stomach is unremarkable in appearance. The small bowel is within normal limits. The appendix is normal in caliber, without evidence of appendicitis. The colon is unremarkable in appearance. Vascular/Lymphatic: Minimal calcification is seen along the distal abdominal aorta and its branches. No retroperitoneal or pelvic sidewall lymphadenopathy is seen. Reproductive: The bladder is  mildly distended and grossly unremarkable. The patient is status post hysterectomy. No suspicious adnexal masses are seen. Other: No additional soft tissue abnormalities are seen. Musculoskeletal: No acute osseous abnormalities are identified. The visualized musculature is unremarkable in appearance. IMPRESSION: 1. No acute abnormality seen within the abdomen or pelvis. 2. Minimal aortic atherosclerosis. Electronically Signed   By: Roanna Raider M.D.   On: 06/10/2016 18:44    Procedures Procedures (including critical care time)  Medications Ordered in ED Medications  iopamidol (ISOVUE-300) 61 % injection (100 mLs  Contrast Given 06/10/16 1823)     Initial Impression / Assessment and Plan / ED Course  I have reviewed the triage vital signs and the nursing notes.  Pertinent labs & imaging results that were available during my care of the patient were reviewed by me and considered in my medical decision making (see chart for details).  Patient in no distress on repeat exam. We discussed all findings including reassuring CT scan, labs. Patient notes that she is staying in a shelter. Patient  was recently provided prescription for low cost antihypertensive, states that she thinks she will be able to afford this. I encouraged her to follow-up with our case management services if she is unable to do so. However, there is no evidence for end organ effects from mild hypertension today, nor evidence for acute abdominal processes. With no evidence for distress, patient discharged in stable condition  Final Clinical Impressions(s) / ED Diagnoses  Abdominal pain   Gerhard Munchobert Krysten Veronica, MD 06/10/16 1929

## 2016-06-10 NOTE — Discharge Instructions (Signed)
As discussed, your evaluation today has been largely reassuring.  But, it is important that you monitor your condition carefully, and do not hesitate to return to the ED if you develop new, or concerning changes in your condition. ? ?Otherwise, please follow-up with your physician for appropriate ongoing care. ? ?

## 2016-06-10 NOTE — ED Notes (Signed)
Patient transported to CT 

## 2016-06-13 NOTE — Congregational Nurse Program (Signed)
Congregational Nurse Program Note  Date of Encounter: 05/06/2016  Past Medical History: Past Medical History:  Diagnosis Date  . Hypertension     Encounter Details:  BP check. Client had prescriptions from ED visit.

## 2016-06-14 NOTE — Congregational Nurse Program (Signed)
Congregational Nurse Program Note  Date of Encounter: 05/07/2016  Past Medical History: Past Medical History:  Diagnosis Date  . Hypertension     Encounter Details:  BP check

## 2016-06-15 NOTE — Congregational Nurse Program (Signed)
Congregational Nurse Program Note  Date of Encounter: 05/17/2016  Past Medical History: Past Medical History:  Diagnosis Date  . Hypertension     Encounter Details:     CNP Questionnaire - 05/17/16 1100      Patient Demographics   Is this a new or existing patient? Existing   Patient is considered a/an Not Applicable   Race African-American/Black     Patient Assistance   Location of Patient Assistance [facility]   Patient's financial/insurance status Self-Pay (Uninsured)   Uninsured Patient ([place] Card/Care Connects) Yes   Interventions Counseled to make appt. with provider   Patient referred to apply for the following financial assistance [employer]   Food insecurities addressed Not Applicable   Transportation assistance No   Type of Assistance Other   Assistance securing medications No   Type of Pharmacist, hospitalAssistance Other   Educational health offerings Navigating the healthcare system     Encounter Details   Primary purpose of visit Education/Health Concerns   Was an Emergency Department visit averted? Not Applicable   Does patient have a medical provider? Yes   Patient referred to Not Applicable   Was a mental health screening completed? (GAINS tool) No   Does patient have dental issues? No   Does patient have vision issues? No   Does your patient have an abnormal blood pressure today? No   Since previous encounter, have you referred patient for abnormal blood pressure that resulted in a new diagnosis or medication change? No   Does your patient have an abnormal blood glucose today? No   Since previous encounter, have you referred patient for abnormal blood glucose that resulted in a new diagnosis or medication change? No   Was there a life-saving intervention made? No     Bp check

## 2016-06-20 ENCOUNTER — Encounter (HOSPITAL_COMMUNITY): Payer: Self-pay | Admitting: Emergency Medicine

## 2016-06-20 ENCOUNTER — Emergency Department (HOSPITAL_COMMUNITY)
Admission: EM | Admit: 2016-06-20 | Discharge: 2016-06-20 | Disposition: A | Discharge status: Home / Self Care | Payer: Medicaid Other | Attending: Emergency Medicine | Admitting: Emergency Medicine

## 2016-06-20 DIAGNOSIS — R42 Dizziness and giddiness: Secondary | ICD-10-CM | POA: Insufficient documentation

## 2016-06-20 DIAGNOSIS — Z79899 Other long term (current) drug therapy: Secondary | ICD-10-CM | POA: Insufficient documentation

## 2016-06-20 DIAGNOSIS — F17200 Nicotine dependence, unspecified, uncomplicated: Secondary | ICD-10-CM | POA: Insufficient documentation

## 2016-06-20 DIAGNOSIS — I10 Essential (primary) hypertension: Secondary | ICD-10-CM | POA: Insufficient documentation

## 2016-06-20 LAB — CBC
HEMATOCRIT: 36 % (ref 36.0–46.0)
HEMOGLOBIN: 12.1 g/dL (ref 12.0–15.0)
MCH: 32.2 pg (ref 26.0–34.0)
MCHC: 33.6 g/dL (ref 30.0–36.0)
MCV: 95.7 fL (ref 78.0–100.0)
Platelets: 253 10*3/uL (ref 150–400)
RBC: 3.76 MIL/uL — ABNORMAL LOW (ref 3.87–5.11)
RDW: 13.2 % (ref 11.5–15.5)
WBC: 7.4 10*3/uL (ref 4.0–10.5)

## 2016-06-20 LAB — CBG MONITORING, ED: GLUCOSE-CAPILLARY: 83 mg/dL (ref 65–99)

## 2016-06-20 LAB — BASIC METABOLIC PANEL
Anion gap: 8 (ref 5–15)
BUN: 13 mg/dL (ref 6–20)
CALCIUM: 8.9 mg/dL (ref 8.9–10.3)
CO2: 24 mmol/L (ref 22–32)
Chloride: 107 mmol/L (ref 101–111)
Creatinine, Ser: 0.88 mg/dL (ref 0.44–1.00)
GFR calc Af Amer: >60 mL/min (ref >60)
GLUCOSE: 89 mg/dL (ref 65–99)
Potassium: 3.6 mmol/L (ref 3.5–5.1)
Sodium: 139 mmol/L (ref 135–145)

## 2016-06-20 MED ORDER — MECLIZINE HCL 25 MG PO TABS: 25.0000 mg | ORAL_TABLET | Freq: Three times a day (TID) | ORAL | 0 refills | Status: DC | PRN

## 2016-06-20 MED ORDER — METOCLOPRAMIDE HCL 5 MG/ML IJ SOLN: 10.0000 mg | Freq: Once | INTRAMUSCULAR | Status: DC

## 2016-06-20 MED ORDER — SODIUM CHLORIDE 0.9 % IV BOLUS (SEPSIS): 1000.0000 mL | Freq: Once | INTRAVENOUS | Status: DC

## 2016-06-20 MED ORDER — LORAZEPAM 2 MG/ML IJ SOLN: 0.5000 mg | Freq: Once | INTRAMUSCULAR | Status: DC

## 2016-06-20 NOTE — ED Provider Notes (Signed)
[facility] Provider Note   CSN: [id number] Arrival date & time: 06/20/16  1191 By signing my name below, I, Brandy Holmes, attest that this documentation has been prepared under the direction and in the presence of Brandy Crease, MD . Electronically Signed: Levon Holmes, Scribe. 06/20/2016. 3:18 AM.   History   Chief Complaint Chief Complaint  Patient presents with  . Dizziness  . Nausea  . Emesis   HPI Brandy Holmes is a 46 y.o. female with a history of HTN who presents to the Emergency Department complaining of gradual onset lightheadedness onset today just prior to arrival. Pt states she was woken up tonight and had to sit down because she was "extremely agitated." After sitting down for some time, she reports feeling lightheaded. Per pt, lightheadedness is alleviated by lying still and closing her eyes, and is exacerbated by movement. Pt notes associated nausea, chills, and headache which she describes as pressure. She endorses a hx of vertigo, but states this feels different. Pt has no other complaints or symptoms at this time.   The history is provided by the patient. No language interpreter was used.    Past Medical History:  Diagnosis Date  . Hypertension     There are no active problems to display for this patient.   Past Surgical History:  Procedure Laterality Date  . ABDOMINAL HYSTERECTOMY    . BREAST SURGERY    . CHOLECYSTECTOMY      OB History    No data available       Home Medications    Prior to Admission medications   Medication Sig Start Date End Date Taking? Authorizing Provider  amLODipine (NORVASC) 2.5 MG tablet Take 1 tablet (2.5 mg total) by mouth daily. Patient not taking: Reported on 06/10/2016 04/30/16   Mercedes Street, PA-C  cyclobenzaprine (FLEXERIL) 10 MG tablet Take 1 tablet (10 mg total) by mouth 3 (three) times daily. Patient not taking: Reported on 06/06/2016 05/03/16   Domenick Gong, MD  hydrALAZINE (APRESOLINE) 25 MG  tablet Take 1 tablet (25 mg total) by mouth 3 (three) times daily. Patient not taking: Reported on 06/10/2016 06/06/16   Raeford Razor, MD  ibuprofen (ADVIL,MOTRIN) 800 MG tablet Take 1 tablet (800 mg total) by mouth 3 (three) times daily. Patient not taking: Reported on 06/06/2016 05/03/16   Domenick Gong, MD  meclizine (ANTIVERT) 25 MG tablet Take 1 tablet (25 mg total) by mouth 3 (three) times daily as needed for dizziness. 06/20/16   Brandy Crease, MD  naproxen sodium (ALEVE) 220 MG tablet Take 220-440 mg by mouth 2 (two) times daily as needed (pain).    Historical Provider, MD  traMADol (ULTRAM) 50 MG tablet 1 tab po every 6 hours as needed Maximum dose= 8 tablets per day Patient not taking: Reported on 06/06/2016 05/03/16   Domenick Gong, MD    Family History No family history on file.  Social History Social History  Substance Use Topics  . Smoking status: Current Every Day Smoker  . Smokeless tobacco: Never Used  . Alcohol use No     Allergies   Patient has no known allergies.   Review of Systems Review of Systems 10 systems reviewed and all are negative for acute change except as noted in the HPI.   Physical Exam Updated Vital Signs BP 162/86   Pulse (!) 58   Temp 98.2 F (36.8 C) (Oral)   Resp 12   LMP 10/11/2015   SpO2 100%   Physical  Exam  Constitutional: She is oriented to person, place, and time. She appears well-developed and well-nourished. No distress.  HENT:  Head: Normocephalic and atraumatic.  Right Ear: Hearing normal.  Left Ear: Hearing normal.  Nose: Nose normal.  Mouth/Throat: Oropharynx is clear and moist and mucous membranes are normal.  Eyes: Conjunctivae are normal. Pupils are equal, round, and reactive to light. Right eye exhibits nystagmus.  Neck: Normal range of motion. Neck supple.  Cardiovascular: Regular rhythm, S1 normal and S2 normal.  Exam reveals no gallop and no friction rub.   No murmur heard. Pulmonary/Chest: Effort normal  and breath sounds normal. No respiratory distress. She exhibits no tenderness.  Abdominal: Soft. Normal appearance and bowel sounds are normal. There is no hepatosplenomegaly. There is no tenderness. There is no rebound, no guarding, no tenderness at McBurney's point and negative Murphy's sign. No hernia.  Musculoskeletal: Normal range of motion.  Neurological: She is alert and oriented to person, place, and time. She has normal strength. No cranial nerve deficit or sensory deficit. Coordination normal. GCS eye subscore is 4. GCS verbal subscore is 5. GCS motor subscore is 6.  Skin: Skin is warm, dry and intact. No rash noted. No cyanosis.  Psychiatric: She has a normal mood and affect. Her speech is normal and behavior is normal. Thought content normal.  Nursing note and vitals reviewed.  ED Treatments / Results  DIAGNOSTIC STUDIES:  Oxygen Saturation is 100% on RA, normal by my interpretation.    COORDINATION OF CARE:  3:05 AM Discussed treatment plan with pt at bedside and pt agreed to plan.  Labs (all labs ordered are listed, but only abnormal results are displayed) Labs Reviewed  CBC - Abnormal; Notable for the following:       Result Value   RBC 3.76 (*)    All other components within normal limits  BASIC METABOLIC PANEL  URINALYSIS, ROUTINE W REFLEX MICROSCOPIC  CBG MONITORING, ED    EKG  EKG Interpretation  Date/Time:  Thursday June 20 2016 02:56:45 EST Ventricular Rate:  68 PR Interval:    QRS Duration: 82 QT Interval:  460 QTC Calculation: 490 R Axis:   50 Text Interpretation:  Sinus rhythm Borderline prolonged QT interval Confirmed by Blinda Leatherwood  MD, Moet Mikulski ([id number]) on 06/20/2016 4:55:01 AM       Radiology No results found.  Procedures Procedures (including critical care time)  Medications Ordered in ED Medications  sodium chloride 0.9 % bolus 1,000 mL (not administered)  metoCLOPramide (REGLAN) injection 10 mg (not administered)  LORazepam (ATIVAN)  injection 0.5 mg (not administered)     Initial Impression / Assessment and Plan / ED Course  I have reviewed the triage vital signs and the nursing notes.  Pertinent labs & imaging results that were available during my care of the patient were reviewed by me and considered in my medical decision making (see chart for details).     Patient presents to the emergency department with dizziness and lightheadedness that started upon awakening today. Patient reports that she was awakened suddenly to evacuate the shelter that she is staying up. She noticed that she was very dizzy and has had persistent symptoms of dizziness with moving her head. She did have some nystagmus present on examination, otherwise neurologic examination was normal. Patient treated for vertigo type symptoms with complete resolution. Workup unremarkable. She does not require any further workup at this time.  Final Clinical Impressions(s) / ED Diagnoses   Final diagnoses:  Vertigo  New Prescriptions New Prescriptions   MECLIZINE (ANTIVERT) 25 MG TABLET    Take 1 tablet (25 mg total) by mouth 3 (three) times daily as needed for dizziness.  I personally performed the services described in this documentation, which was scribed in my presence. The recorded information has been reviewed and is accurate.    Brandy Creasehristopher J Shavonda Wiedman, MD 06/20/16 [phone]

## 2016-06-20 NOTE — ED Triage Notes (Signed)
Per [facility]  Pt coming from [facility]. C/o chills, headache,. Lightheadedness and nausea. No diarrhea, ABD pain, no fevers. Pt has not taking her HCTZ in 3 weeks. Pt sensitive to the lights.

## 2016-06-26 NOTE — Congregational Nurse Program (Signed)
Congregational Nurse Program Note  Date of Encounter: 06/25/2016  Past Medical History: Past Medical History:  Diagnosis Date  . Hypertension     Encounter Details:     CNP Questionnaire - 06/26/16 1702      Patient Demographics   Is this a new or existing patient? New   Patient is considered a/an Not Applicable   Race African-American/Black     Patient Assistance   Location of Patient Assistance Not Applicable   Patient's financial/insurance status Medicaid   Uninsured Patient ([place] Card/Care Connects) No   Patient referred to apply for the following financial assistance [place] [employer]   Food insecurities addressed Not Technical brewerApplicable   Transportation assistance No   Assistance securing medications Yes   Type of Holiday representativeAssistance [facility]   Educational health offerings Hypertension;Medications;Navigating the healthcare system;Chronic disease     Encounter Details   Primary purpose of visit Navigating the Healthcare System;Education/Health Concerns;Chronic Illness/Condition Visit;Spiritual Care/Support Visit;Post ED/Hospitalization Visit   Was an Emergency Department visit averted? Not Applicable   Does patient have a medical provider? Yes   Patient referred to Establish PCP;Clinic   Was a mental health screening completed? (GAINS tool) No   Does patient have dental issues? No   Does patient have vision issues? No   Does your patient have an abnormal blood pressure today? Yes   Since previous encounter, have you referred patient for abnormal blood pressure that resulted in a new diagnosis or medication change? No   Does your patient have an abnormal blood glucose today? No   Since previous encounter, have you referred patient for abnormal blood glucose that resulted in a new diagnosis or medication change? No   Was there a life-saving intervention made? No     Initial  Visit  For this  46 year old  [place]. States she  Does  Have  High  Blood  Pressure  But   doesn't have a PCP. Marland Kitchen. Client had  Family  Planning  Medicaid ?? But has never been seen by  Provider on her  Card. Wants to  Go  To  Another  Provider . To  Have  DSS change her  Provider [facility]  [facility]will stop by  To  Get  An appointment . Nurse took  Blood  Pressure  And it  Was up ,counseled  And was seen in  ED 06-06-16  And  Couldn't  Get her medications filled. Nurse will assist with  Helping her to  Get back on her medication  And getting a PCP.  Return tomorrow to check on medications.

## 2016-07-01 NOTE — Congregational Nurse Program (Signed)
Congregational Nurse Program Note  Date of Encounter: 07/24/2016  Past Medical History: Past Medical History:  Diagnosis Date  . Hypertension     Encounter Details:     CNP Questionnaire - 07/01/16 2321      Patient Demographics   Is this a new or existing patient? Existing   Patient is considered a/an Not Applicable   Race African-American/Black     Patient Assistance   Location of Patient Assistance Not Applicable   Patient's financial/insurance status Medicaid   Uninsured Patient ([place] Card/Care Connects) No   Patient referred to apply for the following financial assistance [place] [employer]   Food insecurities addressed Not Technical brewerApplicable   Transportation assistance No   Assistance securing medications Yes   Type of Holiday representativeAssistance [facility]   Educational health offerings Medications;Hypertension     Encounter Details   Primary purpose of visit Navigating the Healthcare System;Education/Health Concerns;Chronic Illness/Condition Visit   Was an Emergency Department visit averted? Not Applicable   Does patient have a medical provider? Yes   Patient referred to Establish PCP;Area Agency;Clinic   Was a mental health screening completed? (GAINS tool) No   Does patient have dental issues? No   Does patient have vision issues? No   Does your patient have an abnormal blood pressure today? No   Since previous encounter, have you referred patient for abnormal blood pressure that resulted in a new diagnosis or medication change? No   Does your patient have an abnormal blood glucose today? No   Since previous encounter, have you referred patient for abnormal blood glucose that resulted in a new diagnosis or medication change? No   Was there a life-saving intervention made? No    Nurse  Was able to pick up  Medication  For  High blood  Pressure  And  [facility]  Client  Was to  Return  For  Blood  Pressure check  No  Show  , wrote a note  And put   Medication in  Clients  Box. Will contact  Next week for follow  Up  And connect  With  Primary  Care for  Care.

## 2016-07-15 NOTE — Congregational Nurse Program (Signed)
Congregational Nurse Program Note  Date of Encounter: 07/09/2016  Past Medical History: Past Medical History:  Diagnosis Date  . Hypertension     Encounter Details:     CNP Questionnaire - 07/15/16 2323      Patient Demographics   Is this a new or existing patient? Existing   Patient is considered a/an Not Applicable   Race African-American/Black     Patient Assistance   Location of Patient Assistance Not Applicable   Patient's financial/insurance status Medicaid   Uninsured Patient ([place] Card/Care Connects) No   Patient referred to apply for the following financial assistance [place] Card/Care Connects Renewal;Medicaid   Food insecurities addressed Not Applicable   Transportation assistance No   Assistance securing medications No   Educational health offerings Chronic disease;Medications;Navigating the healthcare system;Hypertension     Encounter Details   Primary purpose of visit Education/Health Concerns;Navigating the Healthcare System;Spiritual Care/Support Visit   Was an Emergency Department visit averted? Not Applicable   Does patient have a medical provider? No   Patient referred to Establish PCP   Was a mental health screening completed? (GAINS tool) No   Does patient have dental issues? No   Does patient have vision issues? Yes   Was a vision referral made? Yes   Does your patient have an abnormal blood pressure today? Yes   Since previous encounter, have you referred patient for abnormal blood pressure that resulted in a new diagnosis or medication change? No   Does your patient have an abnormal blood glucose today? No   Since previous encounter, have you referred patient for abnormal blood glucose that resulted in a new diagnosis or medication change? No   Was there a life-saving intervention made? No    Client in stats she has been taking her medications that the nurse secured for her .  Nurse  assisted in making her an appointment  with a PCP as she has   [employer]  [facility]  .[facility]  Will send client information packet then make an appointment. Client states she will complete as soon as she gets information as her  Blood  Pressure needs monitoring  And medication evaluation. States she has no headaches  Or arm pains ,does need glasses and eye  Exam ,. Complaints  Of knee pain and  Shoulder pain. Looking  For a job she states but  One that  Will not  Require her to stand all day.  Recheck blood  Pressure  Tomorrow . Counseled  Regarding her high blood  Pressure, Nurse will check for dates for vision Brandy Holmes and share with client.

## 2016-07-15 NOTE — Congregational Nurse Program (Signed)
Congregational Nurse Program Note  Date of Encounter: 07/10/2016  Past Medical History: Past Medical History:  Diagnosis Date  . Hypertension     Encounter Details:     CNP Questionnaire - 07/15/16 2337      Patient Demographics   Is this a new or existing patient? Existing   Patient is considered a/an Not Applicable   Race African-American/Black     Patient Assistance   Location of Patient Assistance Not Applicable   Patient's financial/insurance status Medicaid   Uninsured Patient ([place] Card/Care Connects) No   Patient referred to apply for the following financial assistance Medicaid   Food insecurities addressed Not Applicable   Transportation assistance No   Assistance securing medications No   Educational health offerings Hypertension;Chronic disease;Medications;Navigating the healthcare system     Encounter Details   Primary purpose of visit Education/Health Concerns;Spiritual Care/Support Visit;Navigating the Healthcare System;Chronic Illness/Condition Visit   Was an Emergency Department visit averted? Not Applicable   Does patient have a medical provider? No   Patient referred to Establish PCP   Was a mental health screening completed? (GAINS tool) No   Does patient have dental issues? No   Does patient have vision issues? Yes   Was a vision referral made? Yes   Does your patient have an abnormal blood pressure today? Yes   Since previous encounter, have you referred patient for abnormal blood pressure that resulted in a new diagnosis or medication change? No   Does your patient have an abnormal blood glucose today? No   Since previous encounter, have you referred patient for abnormal blood glucose that resulted in a new diagnosis or medication change? No   Was there a life-saving intervention made? No    In for blood  Pressure check today . 160/68 pulse  54 . Blood pressure better today , still has knee pain ,will complete paper work to get  PCP appointment,  referral to vision Brandy Holmes client aware and will [place] [employer]  [employer] [place] [employer] [facility] Follow  Up on PCP  appointment and  check blood  pressure

## 2016-07-24 NOTE — Congregational Nurse Program (Signed)
Congregational Nurse Program Note  Date of Encounter: 07/24/2016  Past Medical History: Past Medical History:  Diagnosis Date  . Hypertension     Encounter Details:     CNP Questionnaire - 07/24/16 1545      Patient Demographics   Is this a new or existing patient? Existing   Patient is considered a/an Not Applicable   Race African-American/Black     Patient Assistance   Location of Patient Assistance Not Applicable   Patient's financial/insurance status Medicaid   Uninsured Patient ([place] Card/Care Connects) No   Patient referred to apply for the following financial assistance Medicaid   Food insecurities addressed Not Applicable   Transportation assistance No   Assistance securing medications No   Educational health offerings Chronic disease;Interpersonal relationships;Medications     Encounter Details   Primary purpose of visit Chronic Illness/Condition Visit;Education/Health Concerns;Spiritual Care/Support Visit   Was an Emergency Department visit averted? Not Applicable   Does patient have a medical provider? No   Patient referred to Establish PCP   Was a mental health screening completed? (GAINS tool) No   Does patient have dental issues? No   Does patient have vision issues? Yes   Was a vision referral made? No   Does your patient have an abnormal blood pressure today? Yes   Since previous encounter, have you referred patient for abnormal blood pressure that resulted in a new diagnosis or medication change? No   Does your patient have an abnormal blood glucose today? No   Since previous encounter, have you referred patient for abnormal blood glucose that resulted in a new diagnosis or medication change? No   Was there a life-saving intervention made? No    Client in for  Her blood  Pressure  Check . Taking her medications  And her appointment to  Establish  Her [facility]. Client completed her initial questionnaire and has  turned it in. Will need to  Evaluate her  blood  pressure medication ,as blood pressure  Seems to be up and  I .talked with  client about  stress and smoking how  It  Affects the body . Client had smoked about  1 hour before coming in today ,having problems with her roommate. Ask that she  Report any violations of  Room policy to case manager.Client seemed to  Calm down and will keep her goals in mind  And work towards those. monitor B/P

## 2016-07-31 NOTE — Congregational Nurse Program (Signed)
Congregational Nurse Program Note  Date of Encounter: 07/30/2016  Past Medical History: Past Medical History:  Diagnosis Date  . Hypertension     Encounter Details:     CNP Questionnaire - 07/31/16 1911      Patient Demographics   Is this a new or existing patient? Existing   Patient is considered a/an Not Applicable   Race African-American/Black     Patient Assistance   Location of Patient Assistance Not Applicable   Patient's financial/insurance status Medicaid   Uninsured Patient ([place] Card/Care Connects) No   Patient referred to apply for the following financial assistance Not Applicable   Food insecurities addressed Not Applicable   Transportation assistance No   Assistance securing medications No   Educational health offerings Hypertension;Medications     Encounter Details   Primary purpose of visit Chronic Illness/Condition Visit;Education/Health Concerns;Spiritual Care/Support Visit;Navigating the Healthcare System   Was an Emergency Department visit averted? Not Applicable   Does patient have a medical provider? Yes   Patient referred to Establish PCP   Was a mental health screening completed? (GAINS tool) No   Does patient have dental issues? No   Does patient have vision issues? Yes   Was a vision referral made? Yes   Does your patient have an abnormal blood pressure today? Yes   Since previous encounter, have you referred patient for abnormal blood pressure that resulted in a new diagnosis or medication change? No   Does your patient have an abnormal blood glucose today? No   Since previous encounter, have you referred patient for abnormal blood glucose that resulted in a new diagnosis or medication change? No   Was there a life-saving intervention made? No    Client is taking her high blood  Pressure medication  But  Blood  Pressure remains high. Has her appt with PCP this month and will need to be evaluated regarding her blood pressure medication.Client  realizes she is over weight and has lost a lost ?60 lbs. She understands the relationship between high blood pressure ,exercise and taking her medications. Got a refill on her blood pressure medication and is taking it .  Follow weekly will give her a log of her blood pressure reading to take to  MD .

## 2016-08-07 NOTE — Congregational Nurse Program (Signed)
Congregational Nurse Program Note  Date of Encounter: 08/06/2016  Past Medical History: Past Medical History:  Diagnosis Date  . Hypertension     Encounter Details:     CNP Questionnaire - 08/07/16 1557      Patient Demographics   Is this a new or existing patient? Existing   Patient is considered a/an Not Applicable   Race African-American/Black     Patient Assistance   Location of Patient Assistance Not Applicable   Patient's financial/insurance status Medicaid   Uninsured Patient ([place] Card/Care Connects) No   Patient referred to apply for the following financial assistance Not Applicable   Food insecurities addressed Not Applicable   Transportation assistance No   Assistance securing medications No   Educational health offerings Hypertension;Medications;Other     Encounter Details   Primary purpose of visit Education/Health Concerns;Spiritual Care/Support Visit;Chronic Illness/Condition Visit   Was an Emergency Department visit averted? Not Applicable   Does patient have a medical provider? Yes   Patient referred to Establish PCP   Was a mental health screening completed? (GAINS tool) No   Does patient have dental issues? No   Does patient have vision issues? Yes   Was a vision referral made? No   Does your patient have an abnormal blood pressure today? Yes   Since previous encounter, have you referred patient for abnormal blood pressure that resulted in a new diagnosis or medication change? No   Does your patient have an abnormal blood glucose today? No   Since previous encounter, have you referred patient for abnormal blood glucose that resulted in a new diagnosis or medication change? No   Was there a life-saving intervention made? No    Client in for blood  Pressure check ,got herself a job at  [employer] ,excited ,just  Smoked a cigarette. Blood  Pressure way up ,took it  Again 10 minutes later 180/88  Pulse 74. Counseled regarding  Smoking and high blood   Pressure ,encouraged to quit. Will need to  Take blood  Pressure after she waits 1 hour to decide what to do. Client back in before she leaves for work  Blood  Pressure taken  172/86 pulse 75. Blood pressure came down but  When client goes in 2 weeks to see her PCP she will take blood  Pressure readings and PCP can determine if medication needs to be increased or added to to get a better control of her blood pressure.  Will recheck next week .

## 2016-08-15 NOTE — Progress Notes (Deleted)
   Subjective:   Patient ID: [name]    DOB: [birth year], 46 y.o. female   MRN: [id number]  CC: "***"  HPI: Reyonna Haack is a 46 y.o. female who presents to clinic today to establish care. Problems discussed today are as follows:  ***: ***  ROS: complete ROS performed, see HPI for pertinent ROS.  PMFSH: ***. Smoking status reviewed. Medications reviewed.  Objective:   LMP 10/11/2015  Vitals and nursing note reviewed.  General: well nourished, well developed, in no acute distress with non-toxic appearance HEENT: normocephalic, atraumatic, moist mucous membranes Neck: supple, non-tender without lymphadenopathy CV: regular rate and rhythm without murmurs, rubs, or gallops, no lower extremity edema Lungs: clear to auscultation bilaterally with normal work of breathing Abdomen: soft, non-tender, non-distended, no masses or organomegaly palpable, normoactive bowel sounds Skin: warm, dry, no rashes or lesions, cap refill < 2 seconds Extremities: warm and well perfused, normal tone  Assessment & Plan:   No problem-specific Assessment & Plan notes found for this encounter.  No orders of the defined types were placed in this encounter.  No orders of the defined types were placed in this encounter.   Durward Parcel, DO [facility], PGY-1 08/15/2016 8:25 PM

## 2016-08-16 ENCOUNTER — Ambulatory Visit: Payer: Medicaid Other | Admitting: Family Medicine

## 2016-08-19 ENCOUNTER — Emergency Department (HOSPITAL_COMMUNITY)
Admission: EM | Admit: 2016-08-19 | Discharge: 2016-08-19 | Disposition: A | Discharge status: Home / Self Care | Payer: Medicaid Other | Attending: Emergency Medicine | Admitting: Emergency Medicine

## 2016-08-19 ENCOUNTER — Encounter (HOSPITAL_COMMUNITY): Payer: Self-pay | Admitting: Emergency Medicine

## 2016-08-19 DIAGNOSIS — I10 Essential (primary) hypertension: Secondary | ICD-10-CM | POA: Insufficient documentation

## 2016-08-19 DIAGNOSIS — Y990 Civilian activity done for income or pay: Secondary | ICD-10-CM | POA: Insufficient documentation

## 2016-08-19 DIAGNOSIS — F17200 Nicotine dependence, unspecified, uncomplicated: Secondary | ICD-10-CM | POA: Insufficient documentation

## 2016-08-19 DIAGNOSIS — Z79899 Other long term (current) drug therapy: Secondary | ICD-10-CM | POA: Insufficient documentation

## 2016-08-19 DIAGNOSIS — X500XXA Overexertion from strenuous movement or load, initial encounter: Secondary | ICD-10-CM | POA: Insufficient documentation

## 2016-08-19 DIAGNOSIS — M25531 Pain in right wrist: Secondary | ICD-10-CM | POA: Insufficient documentation

## 2016-08-19 DIAGNOSIS — Y9389 Activity, other specified: Secondary | ICD-10-CM | POA: Insufficient documentation

## 2016-08-19 DIAGNOSIS — Y9289 Other specified places as the place of occurrence of the external cause: Secondary | ICD-10-CM | POA: Insufficient documentation

## 2016-08-19 DIAGNOSIS — M25532 Pain in left wrist: Secondary | ICD-10-CM | POA: Insufficient documentation

## 2016-08-19 MED ORDER — HYDROCODONE-ACETAMINOPHEN 5-325 MG PO TABS
1.0000 | ORAL_TABLET | Freq: Once | ORAL | Status: AC
  Administered 2016-08-19: 1 via ORAL
  Filled 2016-08-19: qty 1

## 2016-08-19 NOTE — ED Notes (Signed)
See md assessment 

## 2016-08-19 NOTE — ED Triage Notes (Signed)
Pt reports waking up with bilateral arm pain this am, reports she has a new job where she does a lot of lifting, took aleve at home with no relief, no known injuries.

## 2016-08-19 NOTE — ED Provider Notes (Signed)
[facility]-EMERGENCY DEPT Provider Note   CSN: [id number] Arrival date & time: 08/19/16  1221   By signing my name below, I, Soijett Blue, attest that this documentation has been prepared under the direction and in the presence of Gwyneth Sprout, MD. Electronically Signed: Soijett Blue, ED Scribe. 08/19/16. 1:35 PM.  History   Chief Complaint Chief Complaint  Patient presents with  . Arm Pain    HPI Brandy Holmes is a 46 y.o. female with a PMHx of HTN, who presents to the Emergency Department complaining of intermittent, bilateral wrist pain left > right onset 5 AM this morning. She notes that her bilateral wrist pain radiates to her bilateral elbows and is worsened with movement. She states that she has a prior hx of similar symptoms that resolved on their own. Pt is right hand dominant. Pt reports associated tingling to bilateral hands. Pt has tried aleve with her last dose being at 6 AM with no relief of her symptoms. She notes that she started a new job 1 month ago where she completes a lot of heavy lifting. She denies wrist swelling, numbness, color change, wound, and any other symptoms. Denies PMHx of DM and notes that she had a hx of high cholesterol that is now controlled without medication. Pt states that she stays at the [facility] and has her medications provided to her by the nurse on staff. Pt reports that she smokes cigarettes, but denies ETOH or illegal drug use.   The history is provided by the patient. No language interpreter was used.    Past Medical History:  Diagnosis Date  . Hypertension     There are no active problems to display for this patient.   Past Surgical History:  Procedure Laterality Date  . ABDOMINAL HYSTERECTOMY    . BREAST SURGERY    . CHOLECYSTECTOMY      OB History    No data available       Home Medications    Prior to Admission medications   Medication Sig Start Date End Date Taking? Authorizing Provider  amLODipine  (NORVASC) 2.5 MG tablet Take 1 tablet (2.5 mg total) by mouth daily. Patient not taking: Reported on 06/10/2016 04/30/16   Mercedes Street, PA-C  cyclobenzaprine (FLEXERIL) 10 MG tablet Take 1 tablet (10 mg total) by mouth 3 (three) times daily. Patient not taking: Reported on 06/06/2016 05/03/16   Domenick Gong, MD  hydrALAZINE (APRESOLINE) 25 MG tablet Take 1 tablet (25 mg total) by mouth 3 (three) times daily. Patient not taking: Reported on 06/10/2016 06/06/16   Raeford Razor, MD  ibuprofen (ADVIL,MOTRIN) 800 MG tablet Take 1 tablet (800 mg total) by mouth 3 (three) times daily. Patient not taking: Reported on 06/06/2016 05/03/16   Domenick Gong, MD  meclizine (ANTIVERT) 25 MG tablet Take 1 tablet (25 mg total) by mouth 3 (three) times daily as needed for dizziness. 06/20/16   Gilda Crease, MD  naproxen sodium (ALEVE) 220 MG tablet Take 220-440 mg by mouth 2 (two) times daily as needed (pain).    Historical Provider, MD  traMADol (ULTRAM) 50 MG tablet 1 tab po every 6 hours as needed Maximum dose= 8 tablets per day Patient not taking: Reported on 06/06/2016 05/03/16   Domenick Gong, MD    Family History No family history on file.  Social History Social History  Substance Use Topics  . Smoking status: Current Every Day Smoker  . Smokeless tobacco: Never Used  . Alcohol use No  Allergies   Patient has no known allergies.   Review of Systems Review of Systems  Respiratory: Negative for shortness of breath.   Cardiovascular: Positive for chest pain (intermittent x 1 year).  All other systems reviewed and are negative.    Physical Exam Updated Vital Signs BP (!) 184/83 (BP Location: Left Arm)   Pulse 69   Temp 97.6 F (36.4 C) (Oral)   Resp 16   LMP 10/11/2015   SpO2 100%   Physical Exam  Constitutional: She is oriented to person, place, and time. She appears well-developed and well-nourished. No distress.  HENT:  Head: Normocephalic and atraumatic.  Eyes: EOM are  normal.  Neck: Neck supple.  Cardiovascular: Normal rate.   Pulmonary/Chest: Effort normal. No respiratory distress.  Abdominal: She exhibits no distension.  Musculoskeletal: Normal range of motion.       Right wrist: She exhibits tenderness. She exhibits no swelling.       Left wrist: She exhibits tenderness. She exhibits no swelling.  Tenderness with palpation over bilateral thenar eminence. Nl sensation in fingers. 2+ radial pulses bilaterally. No noted swelling. Minimal tenderness with wrist flexion. No tenderness with wrist extension.   Neurological: She is alert and oriented to person, place, and time.  Skin: Skin is warm and dry.  Psychiatric: She has a normal mood and affect. Her behavior is normal.  Nursing note and vitals reviewed.    ED Treatments / Results  DIAGNOSTIC STUDIES: Oxygen Saturation is 100% on RA, nl by my interpretation.    COORDINATION OF CARE: 1:35 PM Discussed treatment plan with pt at bedside which includes norco, wrist splint, and pt agreed to plan.   Procedures Procedures (including critical care time)  Medications Ordered in ED Medications  HYDROcodone-acetaminophen (NORCO/VICODIN) 5-325 MG per tablet 1 tablet (1 tablet Oral Given 08/19/16 1407)     Initial Impression / Assessment and Plan / ED Course  I have reviewed the triage vital signs and the nursing notes.   Patient presenting with bilateral wrist pain worse over the thenar eminence since with some occasional numbness and tingling.Patient does have a significant past medical history for hypertension but symptoms today do not seem heart related. Pain is reproduced by moving the hand and wrist and with palpation. Patient does occasionally chest pain and shortness of breath but it sounds like this has been going on for over a year and she had testing done with a negative MRI of her heart within the last year per patient. Patient is hypertensive today but has not taken her afternoon dose of  medication.    Neurovascularly intact. Most likely from heavy lifting done at her work. She was given wrist braces and follow-up. Also encouraged to take her antihypertensive medication.  Final Clinical Impressions(s) / ED Diagnoses   Final diagnoses:  Pain in both wrists    New Prescriptions Discharge Medication List as of 08/19/2016  1:41 PM     I personally performed the services described in this documentation, which was scribed in my presence.  The recorded information has been reviewed and considered.     Gwyneth Sprout, MD 08/19/16 1450

## 2016-08-21 NOTE — Congregational Nurse Program (Signed)
Congregational Nurse Program Note  Date of Encounter: 08/20/2016  Past Medical History: Past Medical History:  Diagnosis Date  . Hypertension     Encounter Details:     CNP Questionnaire - 08/21/16 0251      Patient Demographics   Is this a new or existing patient? Existing   Patient is considered a/an Not Applicable   Race African-American/Black     Patient Assistance   Location of Patient Assistance Not Applicable   Patient's financial/insurance status Medicaid   Uninsured Patient ([place] Card/Care Connects) No   Patient referred to apply for the following financial assistance Not Applicable   Food insecurities addressed Not Applicable   Transportation assistance No   Assistance securing medications No   Educational health offerings Hypertension;Medications     Encounter Details   Primary purpose of visit Chronic Illness/Condition Visit;Education/Health Concerns;Spiritual Care/Support Visit;Post ED/Hospitalization Visit   Was an Emergency Department visit averted? Not Applicable   Does patient have a medical provider? Yes   Patient referred to Follow up with established PCP   Was a mental health screening completed? (GAINS tool) No   Does patient have dental issues? No   Does patient have vision issues? Yes   Was a vision referral made? No   Does your patient have an abnormal blood pressure today? Yes   Since previous encounter, have you referred patient for abnormal blood pressure that resulted in a new diagnosis or medication change? No   Does your patient have an abnormal blood glucose today? No   Since previous encounter, have you referred patient for abnormal blood glucose that resulted in a new diagnosis or medication change? No   Was there a life-saving intervention made? No    Client states she was seen in ED on Monday for wrist  Pain states it was a 7 and today pain level was about 4.5,taking her pain medication , out of work for several days ,states she may  have to have surgery if pain continues.Client has applied for disability . Has upcoming appointment with her newly established  PCP at  [facility].. Nurse ask client to  Return in 1 hour  To  Recheck her blood  Pressure that is really high . Client agreed but  Failed to  Return for recheck  . Medication may  Need to  Be  Evaluated as blood  Stays high. No other symptoms except  Pain high and that  May account for rise in  Recheck B/P on 08-21-16

## 2016-08-31 ENCOUNTER — Encounter (HOSPITAL_COMMUNITY): Payer: Self-pay | Admitting: Emergency Medicine

## 2016-08-31 ENCOUNTER — Ambulatory Visit (HOSPITAL_COMMUNITY)
Admission: EM | Admit: 2016-08-31 | Discharge: 2016-08-31 | Disposition: A | Discharge status: Home / Self Care | Payer: Medicaid Other | Attending: Internal Medicine | Admitting: Internal Medicine

## 2016-08-31 DIAGNOSIS — A09 Infectious gastroenteritis and colitis, unspecified: Secondary | ICD-10-CM

## 2016-08-31 DIAGNOSIS — R1031 Right lower quadrant pain: Secondary | ICD-10-CM

## 2016-08-31 MED ORDER — ONDANSETRON 4 MG PO TBDP
ORAL_TABLET | ORAL | Status: AC
  Filled 2016-08-31: qty 1

## 2016-08-31 MED ORDER — ONDANSETRON 4 MG PO TBDP
4.0000 mg | ORAL_TABLET | Freq: Once | ORAL | Status: AC
  Administered 2016-08-31: 4 mg via ORAL

## 2016-08-31 NOTE — ED Provider Notes (Signed)
CSN: [id number]     Arrival date & time 08/31/16  1741 History   First MD Initiated Contact with Patient 08/31/16 1833     Chief Complaint  Patient presents with  . Abdominal Pain   (Consider location/radiation/quality/duration/timing/severity/associated sxs/prior Treatment) 46 y.o. female presents with sudden onset of diarrhea  Starting at 0200. Patient reports nausea, denies vomiting, fevers, pain with urination.  Unknown sick contacts Condition is acute in nature. Condition is made better by nothing. Condition is made worse by nothing. Patient denies any treatment prior to there arrival at this facility. Patient has unknown sick contacts.        Past Medical History:  Diagnosis Date  . Hypertension    Past Surgical History:  Procedure Laterality Date  . ABDOMINAL HYSTERECTOMY    . BREAST SURGERY    . CHOLECYSTECTOMY     History reviewed. No pertinent family history. Social History  Substance Use Topics  . Smoking status: Current Every Day Smoker  . Smokeless tobacco: Never Used  . Alcohol use No   OB History    No data available     Review of Systems  Constitutional: Negative for chills and fever.  HENT: Negative for ear pain and sore throat.   Eyes: Negative for pain and visual disturbance.  Respiratory: Negative for cough and shortness of breath.   Cardiovascular: Negative for chest pain and palpitations.  Gastrointestinal: Positive for abdominal pain ( diffuse), diarrhea and nausea. Negative for vomiting.  Genitourinary: Negative for dysuria and hematuria.  Musculoskeletal: Negative for arthralgias and back pain.  Skin: Negative for color change and rash.  Neurological: Negative for seizures and syncope.  All other systems reviewed and are negative.   Allergies  Patient has no known allergies.  Home Medications   Prior to Admission medications   Medication Sig Start Date End Date Taking? Authorizing Provider  hydrALAZINE (APRESOLINE) 25 MG tablet Take 1  tablet (25 mg total) by mouth 3 (three) times daily. 06/06/16  Yes Raeford RazorKohut, Stephen, MD  ibuprofen (ADVIL,MOTRIN) 800 MG tablet Take 1 tablet (800 mg total) by mouth 3 (three) times daily. 05/03/16  Yes Domenick GongMortenson, Ashley, MD  amLODipine (NORVASC) 2.5 MG tablet Take 1 tablet (2.5 mg total) by mouth daily. Patient not taking: Reported on 06/10/2016 04/30/16   Street, [place], PA-C  cyclobenzaprine (FLEXERIL) 10 MG tablet Take 1 tablet (10 mg total) by mouth 3 (three) times daily. Patient not taking: Reported on 06/06/2016 05/03/16   Domenick GongMortenson, Ashley, MD  meclizine (ANTIVERT) 25 MG tablet Take 1 tablet (25 mg total) by mouth 3 (three) times daily as needed for dizziness. 06/20/16   Gilda CreasePollina, Christopher J, MD  naproxen sodium (ALEVE) 220 MG tablet Take 220-440 mg by mouth 2 (two) times daily as needed (pain).    [provider]  traMADol (ULTRAM) 50 MG tablet 1 tab po every 6 hours as needed Maximum dose= 8 tablets per day Patient not taking: Reported on 06/06/2016 05/03/16   Domenick GongMortenson, Ashley, MD   Meds Ordered and Administered this Visit   Medications  ondansetron (ZOFRAN-ODT) disintegrating tablet 4 mg (4 mg Oral Given 08/31/16 1845)    BP (!) 171/79 (BP Location: Left Arm)   Pulse 63   Temp 98.2 F (36.8 C) (Oral)   Resp 20   LMP 10/11/2015   SpO2 100%  No data found.   Physical Exam  Constitutional: She is oriented to person, place, and time. She appears well-developed and well-nourished.  HENT:  Head: Normocephalic and atraumatic.  Eyes: Conjunctivae are normal.  Neck: Normal range of motion.  Cardiovascular: Normal rate and regular rhythm.   Pulmonary/Chest: Effort normal and breath sounds normal.  Abdominal: Soft. She exhibits no mass. There is tenderness ( right lower quadrant. ). There is no rebound and no guarding. No hernia.  Hyperactive bowel sounds in all 4 quadrant.   Neurological: She is alert and oriented to person, place, and time.  Skin: Skin is warm and dry.   Psychiatric: She has a normal mood and affect.  Nursing note and vitals reviewed.   Urgent Care Course    Patient declines any intervention for blood pressure at this time. Procedures (including critical care time)  Labs Review Labs Reviewed - No data to display  Imaging Review No results found.    MDM   1. Diarrhea of infectious origin       Alene Mires, NP 08/31/16 1850

## 2016-08-31 NOTE — ED Triage Notes (Signed)
Pt c/o intermittent mild abd pain onset 0200 associated w/diarrhea, nauseas  Denies fevers, vomiting, urinary sx  A&O x4... NAD

## 2016-10-29 ENCOUNTER — Emergency Department (HOSPITAL_COMMUNITY): Payer: Medicaid Other

## 2016-10-29 ENCOUNTER — Encounter (HOSPITAL_COMMUNITY): Payer: Self-pay

## 2016-10-29 ENCOUNTER — Emergency Department (HOSPITAL_COMMUNITY)
Admission: EM | Admit: 2016-10-29 | Discharge: 2016-10-29 | Disposition: A | Discharge status: Home / Self Care | Payer: Medicaid Other | Attending: Emergency Medicine | Admitting: Emergency Medicine

## 2016-10-29 DIAGNOSIS — I10 Essential (primary) hypertension: Secondary | ICD-10-CM | POA: Insufficient documentation

## 2016-10-29 DIAGNOSIS — M25711 Osteophyte, right shoulder: Secondary | ICD-10-CM | POA: Insufficient documentation

## 2016-10-29 DIAGNOSIS — M25511 Pain in right shoulder: Secondary | ICD-10-CM

## 2016-10-29 DIAGNOSIS — M779 Enthesopathy, unspecified: Secondary | ICD-10-CM

## 2016-10-29 DIAGNOSIS — Z79899 Other long term (current) drug therapy: Secondary | ICD-10-CM | POA: Insufficient documentation

## 2016-10-29 DIAGNOSIS — F17200 Nicotine dependence, unspecified, uncomplicated: Secondary | ICD-10-CM | POA: Insufficient documentation

## 2016-10-29 MED ORDER — IBUPROFEN 800 MG PO TABS
800.0000 mg | ORAL_TABLET | Freq: Once | ORAL | Status: AC
  Administered 2016-10-29: 800 mg via ORAL
  Filled 2016-10-29: qty 1

## 2016-10-29 NOTE — Discharge Instructions (Signed)
You can wear the sling as needed for comfort. You can also apply ice to help with pain and inflammation. You can take 800 mg of ibuprofen as needed with food for pain and inflammation control. Please call Dr. Shelba FlakeLandau's office to call and schedule a follow-up appointment. If you develop new symptoms or if your symptoms significantly worsen, please return to the emergency department for reevaluation.  You can also apply hydrocortisone cream or calamine lotion to help with itching for insect bites.
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
## 2016-10-29 NOTE — ED Notes (Signed)
See EDP secondary assessment.  

## 2017-03-06 ENCOUNTER — Emergency Department (HOSPITAL_COMMUNITY)
Admission: EM | Admit: 2017-03-06 | Discharge: 2017-03-06 | Disposition: A | Discharge status: Home / Self Care | Payer: Self-pay | Attending: Emergency Medicine | Admitting: Emergency Medicine

## 2017-03-06 ENCOUNTER — Encounter (HOSPITAL_COMMUNITY): Payer: Self-pay

## 2017-03-06 DIAGNOSIS — Z79899 Other long term (current) drug therapy: Secondary | ICD-10-CM | POA: Insufficient documentation

## 2017-03-06 DIAGNOSIS — S39012A Strain of muscle, fascia and tendon of lower back, initial encounter: Secondary | ICD-10-CM | POA: Insufficient documentation

## 2017-03-06 DIAGNOSIS — Y929 Unspecified place or not applicable: Secondary | ICD-10-CM | POA: Insufficient documentation

## 2017-03-06 DIAGNOSIS — Y999 Unspecified external cause status: Secondary | ICD-10-CM | POA: Insufficient documentation

## 2017-03-06 DIAGNOSIS — F17200 Nicotine dependence, unspecified, uncomplicated: Secondary | ICD-10-CM | POA: Insufficient documentation

## 2017-03-06 DIAGNOSIS — I10 Essential (primary) hypertension: Secondary | ICD-10-CM | POA: Insufficient documentation

## 2017-03-06 DIAGNOSIS — X58XXXA Exposure to other specified factors, initial encounter: Secondary | ICD-10-CM | POA: Insufficient documentation

## 2017-03-06 DIAGNOSIS — Y939 Activity, unspecified: Secondary | ICD-10-CM | POA: Insufficient documentation

## 2017-03-06 MED ORDER — CYCLOBENZAPRINE HCL 10 MG PO TABS: 10.0000 mg | ORAL_TABLET | Freq: Every day | ORAL | 0 refills | Status: AC

## 2017-03-06 NOTE — ED Notes (Signed)
Bed: WTR8 Expected date:  Expected time:  Means of arrival:  Comments: 

## 2017-03-06 NOTE — ED Triage Notes (Signed)
Pt complains of right knee pain for two days and left hip pain, she states the hip pain feels like a burning sensation and the knee is an arthritis flare up No injury noted

## 2017-03-06 NOTE — Discharge Instructions (Signed)
You may use over-the-counter Motrin (Ibuprofen), Acetaminophen (Tylenol), topical muscle creams such as SalonPas, Icy Hot, Bengay, etc. Please stretch, apply heat, and have massage therapy for additional assistance. ° °

## 2017-03-06 NOTE — ED Notes (Signed)
Bed: WTR9 Expected date:  Expected time:  Means of arrival:  Comments: 

## 2017-03-06 NOTE — ED Provider Notes (Signed)
[facility]-EMERGENCY DEPT Provider Note  CSN: [id number] Arrival date & time: 03/06/17 [id number]  Chief Complaint(s) Knee Pain and Hip Pain  HPI Brandy Holmes is a 46 y.o. female with a history of arthritis presents to the emergency department with exacerbation of her right knee arthritis.  She reports that she stands all day at work and her right knee has been bothering her for several days.  She had associated right knee swelling.  Both the pain and swelling have subsequently improved.  She denies any fevers, trauma, leg pain or swelling.  No prior history of blood clots.  Patient also endorsing left hip pain stating that she is trying to compensate for the pain in her right knee and standing awkwardly.  She also states that she bends over to pick things up at work.  States that her pain is exacerbated with movement and certain positions.  Has been taking Aleve with minimal relief.  Denies any urinary symptoms.  Patient endorses partial hysterectomy.  No bladder or bowel incontinence.  No lower extremity weakness or loss of sensation.  HPI  Past Medical History Past Medical History:  Diagnosis Date  . Hypertension    There are no active problems to display for this patient.  Home Medication(s) Prior to Admission medications   Medication Sig Start Date End Date Taking? Authorizing Provider  amLODipine (NORVASC) 2.5 MG tablet Take 1 tablet (2.5 mg total) by mouth daily. Patient not taking: Reported on 06/10/2016 04/30/16   Street, [place]cedes, PA-C  cyclobenzaprine (FLEXERIL) 10 MG tablet Take 1 tablet (10 mg total) at bedtime for 10 days by mouth. 03/06/17 03/16/17  Nira Connardama, Mykia Holton Eduardo, MD  hydrALAZINE (APRESOLINE) 25 MG tablet Take 1 tablet (25 mg total) by mouth 3 (three) times daily. 06/06/16   Raeford RazorKohut, Stephen, MD  ibuprofen (ADVIL,MOTRIN) 800 MG tablet Take 1 tablet (800 mg total) by mouth 3 (three) times daily. 05/03/16   Domenick GongMortenson, Ashley, MD  meclizine (ANTIVERT) 25 MG  tablet Take 1 tablet (25 mg total) by mouth 3 (three) times daily as needed for dizziness. 06/20/16   Gilda CreasePollina, Christopher J, MD  naproxen sodium (ALEVE) 220 MG tablet Take 220-440 mg by mouth 2 (two) times daily as needed (pain).    [provider]  traMADol (ULTRAM) 50 MG tablet 1 tab po every 6 hours as needed Maximum dose= 8 tablets per day Patient not taking: Reported on 06/06/2016 05/03/16   Domenick GongMortenson, Ashley, MD                                                                                                                                    Past Surgical History Past Surgical History:  Procedure Laterality Date  . ABDOMINAL HYSTERECTOMY    . BREAST SURGERY    . CHOLECYSTECTOMY     Family History History reviewed. No pertinent family history.  Social History Social History   Tobacco Use  .  Smoking status: Current Every Day Smoker  . Smokeless tobacco: Never Used  Substance Use Topics  . Alcohol use: No  . Drug use: No   Allergies Patient has no known allergies.  Review of Systems Review of Systems All other systems are reviewed and are negative for acute change except as noted in the HPI  Physical Exam Vital Signs  I have reviewed the triage vital signs BP (!) 194/78 (BP Location: Right Arm)   Pulse 73   Temp 97.9 F (36.6 C) (Oral)   Resp 16   LMP 10/11/2015   SpO2 100%   Physical Exam  Constitutional: She is oriented to person, place, and time. She appears well-developed and well-nourished. No distress.  HENT:  Head: Normocephalic and atraumatic.  Right Ear: External ear normal.  Left Ear: External ear normal.  Nose: Nose normal.  Eyes: Conjunctivae and EOM are normal. No scleral icterus.  Neck: Normal range of motion and phonation normal.  Cardiovascular: Normal rate and regular rhythm.  Pulmonary/Chest: Effort normal. No stridor. No respiratory distress.  Abdominal: She exhibits no distension.  Musculoskeletal: Normal range of motion. She exhibits  no edema.       Lumbar back: She exhibits tenderness and spasm. She exhibits no bony tenderness.       Back:  Neurological: She is alert and oriented to person, place, and time.  Spine Exam: Strength: 5/5 throughout LE bilaterally (hip flexion/extension, adduction/abduction; knee flexion/extension; foot dorsiflexion/plantarflexion, inversion/eversion; great toe inversion) Sensation: Intact to light touch in proximal and distal LE bilaterally Reflexes: 1+ quadriceps and achilles reflexes   Skin: She is not diaphoretic.  Psychiatric: She has a normal mood and affect. Her behavior is normal.  Vitals reviewed.   ED Results and Treatments Labs (all labs ordered are listed, but only abnormal results are displayed) Labs Reviewed - No data to display                                                                                                                       EKG  EKG Interpretation  Date/Time:    Ventricular Rate:    PR Interval:    QRS Duration:   QT Interval:    QTC Calculation:   R Axis:     Text Interpretation:        Radiology No results found. Pertinent labs & imaging results that were available during my care of the patient were reviewed by me and considered in my medical decision making (see chart for details).  Medications Ordered in ED Medications - No data to display  Procedures Procedures  (including critical care time)  Medical Decision Making / ED Course I have reviewed the nursing notes for this encounter and the patient's prior records (if available in EHR or on provided paperwork).    46 y.o. female presents with back pain in lumbar area for 3 days without signs of radicular pain. No acute traumatic onset. No red flag symptoms of fever, weight loss, saddle anesthesia, weakness, fecal/urinary incontinence or urinary retention.     Suspect MSK etiology. No indication for imaging emergently. Patient was recommended to take short course of scheduled NSAIDs and engage in early mobility as definitive treatment. Return precautions discussed for worsening or new concerning symptoms.    Final Clinical Impression(s) / ED Diagnoses Final diagnoses:  Strain of lumbar region, initial encounter   Disposition: Discharge  Condition: Good  I have discussed the results, Dx and Tx plan with the patient who expressed understanding and agree(s) with the plan. Discharge instructions discussed at great length. The patient was given strict return precautions who verbalized understanding of the instructions. No further questions at time of discharge.    ED Discharge Orders        Ordered    cyclobenzaprine (FLEXERIL) 10 MG tablet  Daily at bedtime     03/06/17 1610       Follow Up: Primary care provider         This chart was dictated using voice recognition software.  Despite best efforts to proofread,  errors can occur which can change the documentation meaning.   Nira Conn, MD 03/06/17 [phone]

## 2017-04-23 ENCOUNTER — Other Ambulatory Visit: Payer: Self-pay

## 2017-04-23 ENCOUNTER — Emergency Department (HOSPITAL_COMMUNITY): Payer: Self-pay

## 2017-04-23 ENCOUNTER — Emergency Department (HOSPITAL_COMMUNITY)
Admission: EM | Admit: 2017-04-23 | Discharge: 2017-04-23 | Disposition: A | Discharge status: Home / Self Care | Payer: Self-pay | Attending: Emergency Medicine | Admitting: Emergency Medicine

## 2017-04-23 ENCOUNTER — Encounter (HOSPITAL_COMMUNITY): Payer: Self-pay | Admitting: Nurse Practitioner

## 2017-04-23 DIAGNOSIS — M25512 Pain in left shoulder: Secondary | ICD-10-CM

## 2017-04-23 DIAGNOSIS — M67814 Other specified disorders of tendon, left shoulder: Secondary | ICD-10-CM

## 2017-04-23 DIAGNOSIS — Z79899 Other long term (current) drug therapy: Secondary | ICD-10-CM | POA: Insufficient documentation

## 2017-04-23 DIAGNOSIS — M7582 Other shoulder lesions, left shoulder: Secondary | ICD-10-CM | POA: Insufficient documentation

## 2017-04-23 DIAGNOSIS — F17210 Nicotine dependence, cigarettes, uncomplicated: Secondary | ICD-10-CM | POA: Insufficient documentation

## 2017-04-23 DIAGNOSIS — I10 Essential (primary) hypertension: Secondary | ICD-10-CM | POA: Insufficient documentation

## 2017-04-23 HISTORY — DX: Unspecified osteoarthritis, unspecified site: M19.90

## 2017-04-23 MED ORDER — CYCLOBENZAPRINE HCL 10 MG PO TABS: 10.0000 mg | ORAL_TABLET | Freq: Two times a day (BID) | ORAL | 0 refills | Status: DC | PRN

## 2017-04-23 NOTE — ED Triage Notes (Signed)
Pt presents with left shoulder pain. States she reached and closed the door reaching back to push it close with the left arm and felt something pop on Christmas eve. States since then pain is worsening and she has limited movement without severe pain. Patient states she has been taking aleve to help with pain and getting min. Response. Denies any other OTC medications or heat/ice therapy. She is A&o x4 and ambulatory.

## 2017-04-23 NOTE — ED Notes (Signed)
Applied sling to left arm.

## 2017-04-23 NOTE — ED Provider Notes (Signed)
[facility]-EMERGENCY DEPT Provider Note   CSN: [id number] Arrival date & time: 04/23/17  [id number]     History   Chief Complaint Chief Complaint  Patient presents with  . Shoulder Pain    HPI Brandy Holmes is a 46 y.o. female.  HPI   46 year old female with history of arthritis and hypertension presents with left shoulder pain.  Patient reports that 2 days ago, she was reaching behind her, and felt a pop in immediate severe pain in her left shoulder.  Reports that the pain has decreased, however she has had continuing pain which is worse with movement over the last 2 days.  Denies numbness, weakness, falls.  Reports she has bone spurs in her right shoulder so she tends to use her left more.  Past Medical History:  Diagnosis Date  . Arthritis   . Hypertension     There are no active problems to display for this patient.   Past Surgical History:  Procedure Laterality Date  . ABDOMINAL HYSTERECTOMY    . BREAST SURGERY    . CHOLECYSTECTOMY      OB History    No data available       Home Medications    Prior to Admission medications   Medication Sig Start Date End Date Taking? Authorizing Provider  ibuprofen (ADVIL,MOTRIN) 200 MG tablet Take 800 mg by mouth every 6 (six) hours as needed for mild pain.   Yes [provider]  naproxen sodium (ALEVE) 220 MG tablet Take 220-440 mg by mouth 2 (two) times daily as needed (pain).   Yes [provider]  amLODipine (NORVASC) 2.5 MG tablet Take 1 tablet (2.5 mg total) by mouth daily. Patient not taking: Reported on 06/10/2016 04/30/16   Street, HillviewMercedes, PA-C  cyclobenzaprine (FLEXERIL) 10 MG tablet Take 1 tablet (10 mg total) by mouth 2 (two) times daily as needed for muscle spasms. 04/23/17   Alvira MondaySchlossman, Mylik Pro, MD  hydrALAZINE (APRESOLINE) 25 MG tablet Take 1 tablet (25 mg total) by mouth 3 (three) times daily. Patient not taking: Reported on 04/23/2017 06/06/16   Raeford RazorKohut, Stephen, MD  ibuprofen  (ADVIL,MOTRIN) 800 MG tablet Take 1 tablet (800 mg total) by mouth 3 (three) times daily. Patient not taking: Reported on 04/23/2017 05/03/16   Domenick GongMortenson, Ashley, MD  meclizine (ANTIVERT) 25 MG tablet Take 1 tablet (25 mg total) by mouth 3 (three) times daily as needed for dizziness. Patient not taking: Reported on 04/23/2017 06/20/16   Gilda CreasePollina, Christopher J, MD  traMADol (ULTRAM) 50 MG tablet 1 tab po every 6 hours as needed Maximum dose= 8 tablets per day Patient not taking: Reported on 04/23/2017 05/03/16   Domenick GongMortenson, Ashley, MD    Family History History reviewed. No pertinent family history.  Social History Social History   Tobacco Use  . Smoking status: Current Every Day Smoker    Packs/day: 0.50  . Smokeless tobacco: Never Used  Substance Use Topics  . Alcohol use: No  . Drug use: No     Allergies   Patient has no known allergies.   Review of Systems Review of Systems  Constitutional: Negative for fever.  Respiratory: Negative for shortness of breath.   Cardiovascular: Negative for chest pain.  Genitourinary: Negative for difficulty urinating.  Musculoskeletal: Positive for arthralgias. Negative for back pain and neck pain.  Skin: Negative for rash.  Neurological: Negative for syncope.     Physical Exam Updated Vital Signs BP (!) 156/86 (BP Location: Right Arm)   Pulse  61   Temp (!) 97.5 F (36.4 C) (Oral)   Resp 18   Ht 5\' 5"  (1.651 m)   Wt 99.8 kg (220 lb)   LMP 10/11/2015   SpO2 100%   BMI 36.61 kg/m   Physical Exam  Constitutional: She is oriented to person, place, and time. She appears well-developed and well-nourished. No distress.  HENT:  Head: Normocephalic and atraumatic.  Eyes: Conjunctivae and EOM are normal.  Neck: Normal range of motion.  Cardiovascular: Normal rate, regular rhythm and intact distal pulses.  Pulmonary/Chest: Effort normal. No respiratory distress. She has no wheezes.  Musculoskeletal: She exhibits no edema.       Left  shoulder: She exhibits tenderness and bony tenderness. She exhibits normal range of motion (however pain with ROM), no deformity, no laceration and normal pulse.  +empty can testing  Neurological: She is alert and oriented to person, place, and time.  Skin: Skin is warm and dry. No rash noted. She is not diaphoretic. No erythema.  Nursing note and vitals reviewed.    ED Treatments / Results  Labs (all labs ordered are listed, but only abnormal results are displayed) Labs Reviewed - No data to display  EKG  EKG Interpretation None       Radiology Dg Shoulder Left  Result Date: 04/23/2017 CLINICAL DATA:  Pain after sudden movement EXAM: LEFT SHOULDER - 2+ VIEW COMPARISON:  None. FINDINGS: Oblique, Y scapular, and axillary images were obtained. There is no acute fracture or dislocation. There is osteoarthritic change in the acromioclavicular joint with mild intra-articular calcification. Glenohumeral joint appears unremarkable. No erosive change. Visualized left lung is clear. 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 Calcification within the joint may represent a degree of calcific tendinosis. No acute fracture or dislocation. Electronically Signed   By: Bretta BangWilliam  Woodruff III M.D.   On: 04/23/2017 08:12    Procedures Procedures (including critical care time)  Medications Ordered in ED Medications - No data to display   Initial Impression / Assessment and Plan / ED Course  I have reviewed the triage vital signs and the nursing notes.  Pertinent labs & imaging results that were available during my care of the patient were reviewed by me and considered in my medical decision making (see chart for details).     46 year old female with history of arthritis and hypertension presents with left shoulder pain.  X-ray shows no sign of fracture or dislocation.  Osteoarthritic changes are noted at the [facility] joint, as well as calcification which may represent calcific  tendinosis.  History and exam consistent with possible rotator cuff pathology.  Patient given shoulder sling, instructed to do range of motion exercises.  Recommend finding a primary care physician for follow-up.  Given prescription for Flexeril, and recommend rest, ibuprofen. Patient discharged in stable condition with understanding of reasons to return.   Final Clinical Impressions(s) / ED Diagnoses   Final diagnoses:  Acute pain of left shoulder  Tendinosis of left shoulder    ED Discharge Orders        Ordered    cyclobenzaprine (FLEXERIL) 10 MG tablet  2 times daily PRN     04/23/17 [id number]       Alvira MondaySchlossman, Gila Lauf, MD 04/23/17 [phone]

## 2017-05-15 ENCOUNTER — Encounter (HOSPITAL_COMMUNITY): Payer: Self-pay | Admitting: Pharmacy Technician

## 2017-05-15 ENCOUNTER — Emergency Department (HOSPITAL_COMMUNITY): Payer: Self-pay

## 2017-05-15 ENCOUNTER — Other Ambulatory Visit: Payer: Self-pay

## 2017-05-15 ENCOUNTER — Emergency Department (HOSPITAL_COMMUNITY)
Admission: EM | Admit: 2017-05-15 | Discharge: 2017-05-15 | Disposition: A | Discharge status: Home / Self Care | Payer: Self-pay | Attending: Emergency Medicine | Admitting: Emergency Medicine

## 2017-05-15 DIAGNOSIS — R1032 Left lower quadrant pain: Secondary | ICD-10-CM | POA: Insufficient documentation

## 2017-05-15 DIAGNOSIS — R1909 Other intra-abdominal and pelvic swelling, mass and lump: Secondary | ICD-10-CM | POA: Insufficient documentation

## 2017-05-15 DIAGNOSIS — I10 Essential (primary) hypertension: Secondary | ICD-10-CM | POA: Insufficient documentation

## 2017-05-15 DIAGNOSIS — N9489 Other specified conditions associated with female genital organs and menstrual cycle: Secondary | ICD-10-CM

## 2017-05-15 DIAGNOSIS — R102 Pelvic and perineal pain: Secondary | ICD-10-CM | POA: Insufficient documentation

## 2017-05-15 DIAGNOSIS — F17200 Nicotine dependence, unspecified, uncomplicated: Secondary | ICD-10-CM | POA: Insufficient documentation

## 2017-05-15 LAB — COMPREHENSIVE METABOLIC PANEL
ALT: 9 U/L — ABNORMAL LOW (ref 14–54)
ANION GAP: 8 (ref 5–15)
AST: 13 U/L — ABNORMAL LOW (ref 15–41)
Albumin: 3.4 g/dL — ABNORMAL LOW (ref 3.5–5.0)
Alkaline Phosphatase: 48 U/L (ref 38–126)
BUN: 7 mg/dL (ref 6–20)
CO2: 25 mmol/L (ref 22–32)
Calcium: 9.2 mg/dL (ref 8.9–10.3)
Chloride: 107 mmol/L (ref 101–111)
Creatinine, Ser: 0.89 mg/dL (ref 0.44–1.00)
Glucose, Bld: 97 mg/dL (ref 65–99)
POTASSIUM: 3.9 mmol/L (ref 3.5–5.1)
Sodium: 140 mmol/L (ref 135–145)
Total Bilirubin: 0.7 mg/dL (ref 0.3–1.2)
Total Protein: 6.4 g/dL — ABNORMAL LOW (ref 6.5–8.1)

## 2017-05-15 LAB — URINALYSIS, ROUTINE W REFLEX MICROSCOPIC
Bilirubin Urine: NEGATIVE
Glucose, UA: NEGATIVE mg/dL
Hgb urine dipstick: NEGATIVE
KETONES UR: NEGATIVE mg/dL
LEUKOCYTES UA: NEGATIVE
NITRITE: NEGATIVE
PH: 5.5 (ref 5.0–8.0)
Protein, ur: NEGATIVE mg/dL
Specific Gravity, Urine: 1.02 (ref 1.005–1.030)

## 2017-05-15 LAB — CBC
HEMATOCRIT: 40.1 % (ref 36.0–46.0)
HEMOGLOBIN: 12.9 g/dL (ref 12.0–15.0)
MCH: 31.5 pg (ref 26.0–34.0)
MCHC: 32.2 g/dL (ref 30.0–36.0)
MCV: 98 fL (ref 78.0–100.0)
Platelets: 297 10*3/uL (ref 150–400)
RBC: 4.09 MIL/uL (ref 3.87–5.11)
RDW: 13.8 % (ref 11.5–15.5)
WBC: 6.7 10*3/uL (ref 4.0–10.5)

## 2017-05-15 LAB — I-STAT BETA HCG BLOOD, ED (MC, WL, AP ONLY): I-stat hCG, quantitative: <5 m[IU]/mL (ref <5)

## 2017-05-15 LAB — LIPASE, BLOOD: LIPASE: 28 U/L (ref 11–51)

## 2017-05-15 MED ORDER — KETOROLAC TROMETHAMINE 30 MG/ML IJ SOLN
30.0000 mg | Freq: Once | INTRAMUSCULAR | Status: AC
  Administered 2017-05-15: 30 mg via INTRAVENOUS
  Filled 2017-05-15: qty 1

## 2017-05-15 MED ORDER — IBUPROFEN 800 MG PO TABS: 800.0000 mg | ORAL_TABLET | Freq: Three times a day (TID) | ORAL | 0 refills | Status: DC | PRN

## 2017-05-15 MED ORDER — IOPAMIDOL (ISOVUE-300) INJECTION 61%
INTRAVENOUS | Status: AC
  Administered 2017-05-15: 100 mL
  Filled 2017-05-15: qty 100

## 2017-05-15 MED ORDER — ONDANSETRON HCL 4 MG/2ML IJ SOLN
4.0000 mg | Freq: Once | INTRAMUSCULAR | Status: AC
  Administered 2017-05-15: 4 mg via INTRAVENOUS
  Filled 2017-05-15: qty 2

## 2017-05-15 MED ORDER — SODIUM CHLORIDE 0.9 % IV BOLUS (SEPSIS)
500.0000 mL | Freq: Once | INTRAVENOUS | Status: AC
  Administered 2017-05-15: 500 mL via INTRAVENOUS

## 2017-05-15 MED ORDER — TRAMADOL HCL 50 MG PO TABS: 50.0000 mg | ORAL_TABLET | Freq: Four times a day (QID) | ORAL | 0 refills | Status: DC | PRN

## 2017-05-15 NOTE — ED Notes (Signed)
Patient ambulatory to bathroom with steady gait at this time 

## 2017-05-15 NOTE — ED Triage Notes (Signed)
Pt c/o LLQ abd pain worse with movement. Onset last night, woke up today and pain was worse. Endorses nausea, denies vomiting diarrhea or fevers.

## 2017-05-15 NOTE — ED Provider Notes (Signed)
Emergency Department Provider Note   I have reviewed the triage vital signs and the nursing notes.   HISTORY  Chief Complaint Abdominal Pain   HPI Brandy Holmes is a 47 y.o. female with PMH of HTN and arthritis presents to the emergency department for evaluation of sudden onset left lower quadrant abdominal pain.  Worse with movement.  She denies any vaginal bleeding or discharge.  No dysuria, hesitancy, urgency.  Pain is nonradiating.  No modifying factors.  Patient denies similar pains in the past.  Denies chest pain or shortness of breath.  No fevers or chills.  Denies any diarrhea or vomiting.  She does have some intermittent nausea.   Past Medical History:  Diagnosis Date  . Arthritis   . Hypertension     There are no active problems to display for this patient.   Past Surgical History:  Procedure Laterality Date  . ABDOMINAL HYSTERECTOMY    . BREAST SURGERY    . CHOLECYSTECTOMY      Current Outpatient Rx  . Order #: [id number] Class: Historical Med  . Order #: [id number] Class: Historical Med  . Order #: [id number] Class: Print  . Order #: [id number] Class: Print  . Order #: [id number] Class: Print  . Order #: [id number] Class: Print  . Order #: [id number] Class: Print  . Order #: [id number] Class: Print    Allergies Patient has no known allergies.  No family history on file.  Social History Social History   Tobacco Use  . Smoking status: Current Every Day Smoker    Packs/day: 0.50  . Smokeless tobacco: Never Used  Substance Use Topics  . Alcohol use: No  . Drug use: No    Review of Systems  Constitutional: No fever/chills Eyes: No visual changes. ENT: No sore throat. Cardiovascular: Denies chest pain. Respiratory: Denies shortness of breath. Gastrointestinal: Positive LLQ abdominal pain. Positive intermittent nausea, no vomiting.  No diarrhea.  No constipation. Genitourinary: Negative for dysuria. Musculoskeletal: Negative for back pain. Skin: Negative  for rash. Neurological: Negative for headaches, focal weakness or numbness.  10-point ROS otherwise negative.  ____________________________________________   PHYSICAL EXAM:  VITAL SIGNS: ED Triage Vitals  Enc Vitals Group     BP 05/15/17 1008 (!) 188/69     Pulse Rate 05/15/17 1008 (!) 54     Resp 05/15/17 1008 18     Temp 05/15/17 1008 98.4 F (36.9 C)     Temp Source 05/15/17 1008 Oral     SpO2 05/15/17 1008 100 %     Pain Score 05/15/17 1017 5   Constitutional: Alert and oriented. Well appearing and in no acute distress. Eyes: Conjunctivae are normal.  Head: Atraumatic. Nose: No congestion/rhinnorhea. Mouth/Throat: Mucous membranes are moist.   Neck: No stridor.  Cardiovascular: Sinus bradycardia. Good peripheral circulation. Grossly normal heart sounds.   Respiratory: Normal respiratory effort.  No retractions. Lungs CTAB. Gastrointestinal: Soft with focal LLQ abdominal pain. No rebound or guarding. No distention.  Musculoskeletal: No lower extremity tenderness nor edema. No gross deformities of extremities. Neurologic:  Normal speech and language. No gross focal neurologic deficits are appreciated.  Skin:  Skin is warm, dry and intact. No rash noted.  ____________________________________________   LABS (all labs ordered are listed, but only abnormal results are displayed)  Labs Reviewed  COMPREHENSIVE METABOLIC PANEL - Abnormal; Notable for the following components:      Result Value   Total Protein 6.4 (*)    Albumin 3.4 (*)    AST 13 (*)    ALT  9 (*)    All other components within normal limits  LIPASE, BLOOD  CBC  URINALYSIS, ROUTINE W REFLEX MICROSCOPIC  I-STAT BETA HCG BLOOD, ED ([facility], [facility], [facility] ONLY)   ____________________________________________  RADIOLOGY  US Transvaginal Non-ob  Result Date: 05/15/2017 CLINICAL DATA:  47 year old female presents with 1 day of left pelvic pain. History of hysterectomy in 2014. Indeterminate left adnexal mass on CT  study from earlier today. EXAM: TRANSABDOMINAL AND TRANSVAGINAL ULTRASOUND OF PELVIS DOPPLER ULTRASOUND OF OVARIES TECHNIQUE: Both transabdominal and transvaginal ultrasound examinations of the pelvis were performed. Transabdominal technique was performed for global imaging of the pelvis including uterus, ovaries, adnexal regions, and pelvic cul-de-sac. It was necessary to proceed with endovaginal exam following the transabdominal exam to visualize the endometrium and adnexa. Color and duplex Doppler ultrasound was utilized to evaluate blood flow to the ovaries. COMPARISON:  05/15/2017 CT abdomen/pelvis. FINDINGS: Uterus Status posthysterectomy. No mass or fluid collection at the vaginal cuff. Right ovary Measurements: 3.8 x 1.7 x 2.8 cm (transabdominal measurement). Normal appearance/no adnexal mass. Left ovary Measurements: 4.9 x 2.7 x 3.0 cm (transabdominal measurement). There is a large irregular thin-walled cystic mass in the left adnexa measuring 15.8 x 6.4 x 4.1 cm, which surrounds the left ovary and generally conforms to the shape of the left peritoneal cavity. Findings are suggestive of a left adnexal peritoneal inclusion cyst. There is a simple 1.7 x 1.2 x 1.5 cm left ovarian cyst. Limited pulse Doppler evaluation of both ovaries on limited transabdominal visualization of the ovaries. Normal venous flows demonstrated in both ovaries. There is probable faint normal low resistance arterial flow in both ovaries. Other findings No abnormal free fluid. IMPRESSION: 1. Suspected large peritoneal inclusion cyst in the left adnexa as detailed. MRI of the pelvis without and with IV contrast is recommended for further characterization of this process, which can be performed on a short term outpatient basis. Gyn consultation advised. 2. No evidence of adnexal torsion. 3. No abnormal findings at the vaginal cuff. Electronically Signed   By: Delbert Phenix M.D.   On: 05/15/2017 21:14   US Pelvis Complete  Result Date:  05/15/2017 CLINICAL DATA:  47 year old female presents with 1 day of left pelvic pain. History of hysterectomy in 2014. Indeterminate left adnexal mass on CT study from earlier today. EXAM: TRANSABDOMINAL AND TRANSVAGINAL ULTRASOUND OF PELVIS DOPPLER ULTRASOUND OF OVARIES TECHNIQUE: Both transabdominal and transvaginal ultrasound examinations of the pelvis were performed. Transabdominal technique was performed for global imaging of the pelvis including uterus, ovaries, adnexal regions, and pelvic cul-de-sac. It was necessary to proceed with endovaginal exam following the transabdominal exam to visualize the endometrium and adnexa. Color and duplex Doppler ultrasound was utilized to evaluate blood flow to the ovaries. COMPARISON:  05/15/2017 CT abdomen/pelvis. FINDINGS: Uterus Status posthysterectomy. No mass or fluid collection at the vaginal cuff. Right ovary Measurements: 3.8 x 1.7 x 2.8 cm (transabdominal measurement). Normal appearance/no adnexal mass. Left ovary Measurements: 4.9 x 2.7 x 3.0 cm (transabdominal measurement). There is a large irregular thin-walled cystic mass in the left adnexa measuring 15.8 x 6.4 x 4.1 cm, which surrounds the left ovary and generally conforms to the shape of the left peritoneal cavity. Findings are suggestive of a left adnexal peritoneal inclusion cyst. There is a simple 1.7 x 1.2 x 1.5 cm left ovarian cyst. Limited pulse Doppler evaluation of both ovaries on limited transabdominal visualization of the ovaries. Normal venous flows demonstrated in both ovaries. There is probable faint normal low resistance  arterial flow in both ovaries. Other findings No abnormal free fluid. IMPRESSION: 1. Suspected large peritoneal inclusion cyst in the left adnexa as detailed. MRI of the pelvis without and with IV contrast is recommended for further characterization of this process, which can be performed on a short term outpatient basis. Gyn consultation advised. 2. No evidence of adnexal  torsion. 3. No abnormal findings at the vaginal cuff. Electronically Signed   By: Delbert PhenixJason A Poff M.D.   On: 05/15/2017 21:14   Ct Abdomen Pelvis W Contrast  Result Date: 05/15/2017 CLINICAL DATA:  Left lower quadrant abdomen pain for 2 days EXAM: CT ABDOMEN AND PELVIS WITH CONTRAST TECHNIQUE: Multidetector CT imaging of the abdomen and pelvis was performed using the standard protocol following bolus administration of intravenous contrast. CONTRAST:  100 mL ISOVUE-300 IOPAMIDOL (ISOVUE-300) INJECTION 61% COMPARISON:  June 10, 2016 FINDINGS: Lower chest: The lung bases are clear. Hepatobiliary: No focal liver abnormality is seen. Status post cholecystectomy. No biliary dilatation. Pancreas: Unremarkable. No pancreatic ductal dilatation or surrounding inflammatory changes. Spleen: Normal in size without focal abnormality. Adrenals/Urinary Tract: Adrenal glands are unremarkable. Kidneys are normal, without renal calculi, focal lesion, or hydronephrosis. Bladder is unremarkable. Stomach/Bowel: Stomach is within normal limits. Appendix appears normal. No evidence of bowel wall thickening, distention, or inflammatory changes. Vascular/Lymphatic: Aortic atherosclerosis. No enlarged abdominal or pelvic lymph nodes. Reproductive: Patient reports she is status post hysterectomy. There is fluid densely masslike lesion in the left pelvis and left adnexa with internal areas of soft tissue and increased density. The posterior portion of this masslike area measures 5.2 x 5.4 cm. The anterior portion of this masslike area measures 3.2 x 7.9 cm. Other: None. Musculoskeletal: Degenerative joint changes of the spine are noted. IMPRESSION: No acute abnormality identified in the abdomen. Fluid density masslike lesion in the left pelvis and left adnexa with internal areas of soft tissue and increased density. The conglomerate of this masslike area measures maximum 13 cm in anterior posterior dimension. The solid enhancing tissue may  represent left ovarian tissue. Some of the fluid surrounding the solid enhancing portions may represent a degree of free fluid. But there likely conglomerate of adjacent cystic masses. Electronically Signed   By: Sherian ReinWei-Chen  Lin M.D.   On: 05/15/2017 17:59   [place] Art/ven Flow Abd Pelv Doppler  Result Date: 05/15/2017 CLINICAL DATA:  47 year old female presents with 1 day of left pelvic pain. History of hysterectomy in 2014. Indeterminate left adnexal mass on CT study from earlier today. EXAM: TRANSABDOMINAL AND TRANSVAGINAL ULTRASOUND OF PELVIS DOPPLER ULTRASOUND OF OVARIES TECHNIQUE: Both transabdominal and transvaginal ultrasound examinations of the pelvis were performed. Transabdominal technique was performed for global imaging of the pelvis including uterus, ovaries, adnexal regions, and pelvic cul-de-sac. It was necessary to proceed with endovaginal exam following the transabdominal exam to visualize the endometrium and adnexa. Color and duplex Doppler ultrasound was utilized to evaluate blood flow to the ovaries. COMPARISON:  05/15/2017 CT abdomen/pelvis. FINDINGS: Uterus Status posthysterectomy. No mass or fluid collection at the vaginal cuff. Right ovary Measurements: 3.8 x 1.7 x 2.8 cm (transabdominal measurement). Normal appearance/no adnexal mass. Left ovary Measurements: 4.9 x 2.7 x 3.0 cm (transabdominal measurement). There is a large irregular thin-walled cystic mass in the left adnexa measuring 15.8 x 6.4 x 4.1 cm, which surrounds the left ovary and generally conforms to the shape of the left peritoneal cavity. Findings are suggestive of a left adnexal peritoneal inclusion cyst. There is a simple 1.7 x 1.2 x 1.5 cm  left ovarian cyst. Limited pulse Doppler evaluation of both ovaries on limited transabdominal visualization of the ovaries. Normal venous flows demonstrated in both ovaries. There is probable faint normal low resistance arterial flow in both ovaries. Other findings No abnormal free fluid.  IMPRESSION: 1. Suspected large peritoneal inclusion cyst in the left adnexa as detailed. MRI of the pelvis without and with IV contrast is recommended for further characterization of this process, which can be performed on a short term outpatient basis. Gyn consultation advised. 2. No evidence of adnexal torsion. 3. No abnormal findings at the vaginal cuff. Electronically Signed   By: Delbert Phenix M.D.   On: 05/15/2017 21:14    ____________________________________________   PROCEDURES  Procedure(s) performed:   Procedures  None ____________________________________________   INITIAL IMPRESSION / ASSESSMENT AND PLAN / ED COURSE  Pertinent labs & imaging results that were available during my care of the patient were reviewed by me and considered in my medical decision making (see chart for details).  Patient presents to the emergency department for evaluation of sudden onset left lower quadrant abdominal pain that is worse with movement.  No vaginal bleeding or discharge.  Pregnancy negative.  Labs unremarkable.  UA negative.  Plan for CT imaging of the abdomen pelvis to evaluate for possible diverticulitis.  Consider pelvic etiology for lower abdominal pain in a female and feel this is less likely but will reassess after CT and pain medication.  Differential diagnosis includes but is not exclusive to ectopic pregnancy, ovarian cyst, ovarian torsion, acute appendicitis, urinary tract infection, endometriosis, bowel obstruction, hernia, colitis, renal colic, gastroenteritis, volvulus etc.  Spoke with Gyn Dr. Debroah Loop on call. Plan for outpatient Gyn follow up. Discussed the findings, plan, and return precautions in detail.   At this time, I do not feel there is any life-threatening condition present. I have reviewed and discussed all results (EKG, imaging, lab, urine as appropriate), exam findings with patient. I have reviewed nursing notes and appropriate previous records.  I feel the patient  is safe to be discharged home without further emergent workup. Discussed usual and customary return precautions. Patient and family (if present) verbalize understanding and are comfortable with this plan.  Patient will follow-up with their primary care provider. If they do not have a primary care provider, information for follow-up has been provided to them. All questions have been answered.   ____________________________________________  FINAL CLINICAL IMPRESSION(S) / ED DIAGNOSES  Final diagnoses:  Left lower quadrant pain  Adnexal mass     MEDICATIONS GIVEN DURING THIS VISIT:  Medications  sodium chloride 0.9 % bolus 500 mL (0 mLs Intravenous Stopped 05/15/17 1720)  ketorolac (TORADOL) 30 MG/ML injection 30 mg (30 mg Intravenous Given 05/15/17 1636)  ondansetron (ZOFRAN) injection 4 mg (4 mg Intravenous Given 05/15/17 1637)  iopamidol (ISOVUE-300) 61 % injection (100 mLs  Contrast Given 05/15/17 1723)     Note:  This document was prepared using Dragon voice recognition software and may include unintentional dictation errors.  Alona Bene, MD Emergency Medicine    Jiles Goya, Arlyss Repress, MD 05/15/17 [phone]

## 2017-05-15 NOTE — Discharge Instructions (Signed)
You were seen in the ED today with a left adnexal mass. We will need you to follow up with the OB/Gyn regarding the findings. Return to the ED with any new or worsening pain.

## 2017-05-15 NOTE — ED Notes (Signed)
Patient transported to CT 

## 2017-05-15 NOTE — ED Notes (Signed)
Lab work, radiology results and vital signs reviewed, no critical results at this time, no change in acuity indicated.  

## 2017-06-04 ENCOUNTER — Encounter: Payer: Self-pay | Admitting: Obstetrics & Gynecology

## 2017-06-04 ENCOUNTER — Ambulatory Visit: Payer: Self-pay | Admitting: Obstetrics & Gynecology

## 2017-06-04 VITALS — BP 161/77 | HR 88 | Wt 220.0 lb

## 2017-06-04 DIAGNOSIS — N949 Unspecified condition associated with female genital organs and menstrual cycle: Secondary | ICD-10-CM

## 2017-06-04 DIAGNOSIS — N83202 Unspecified ovarian cyst, left side: Secondary | ICD-10-CM

## 2017-06-04 NOTE — Progress Notes (Addendum)
Patient ID: Brandy Holmes, female   DOB: 03/22/1971, 47 y.o.   MRN: [id number]  Chief Complaint  Patient presents with  . Abdominal Pain    HPI Brandy Holmes is a 47 y.o. female. Single P4 (26, 25, 23, and 46 yo kids) here today as a [facility] due to an u/s finding of a 15 cm left ovarian cyst, probable peritoneal inclusion cyst.  She has had about a week's h/o dyspareunia.  HPI  Past Medical History:  Diagnosis Date  . Arthritis   . Hypertension     Past Surgical History:  Procedure Laterality Date  . ABDOMINAL HYSTERECTOMY    . BREAST SURGERY    . CHOLECYSTECTOMY      History reviewed. No pertinent family history.  Social History Social History   Tobacco Use  . Smoking status: Current Every Day Smoker    Packs/day: 0.50  . Smokeless tobacco: Never Used  Substance Use Topics  . Alcohol use: No  . Drug use: No    No Known Allergies  Current Outpatient Medications  Medication Sig Dispense Refill  . ibuprofen (ADVIL,MOTRIN) 800 MG tablet Take 1 tablet (800 mg total) by mouth every 8 (eight) hours as needed. 21 tablet 0  . naproxen sodium (ALEVE) 220 MG tablet Take 220-440 mg by mouth 2 (two) times daily as needed (pain).    Marland Kitchen. amLODipine (NORVASC) 2.5 MG tablet Take 1 tablet (2.5 mg total) by mouth daily. (Patient not taking: Reported on 06/10/2016) 30 tablet 0  . cyclobenzaprine (FLEXERIL) 10 MG tablet Take 1 tablet (10 mg total) by mouth 2 (two) times daily as needed for muscle spasms. (Patient not taking: Reported on 05/15/2017) 20 tablet 0  . hydrALAZINE (APRESOLINE) 25 MG tablet Take 1 tablet (25 mg total) by mouth 3 (three) times daily. (Patient not taking: Reported on 04/23/2017) 90 tablet 2  . meclizine (ANTIVERT) 25 MG tablet Take 1 tablet (25 mg total) by mouth 3 (three) times daily as needed for dizziness. (Patient not taking: Reported on 04/23/2017) 30 tablet 0  . traMADol (ULTRAM) 50 MG tablet Take 1 tablet (50 mg total) by mouth every 6 (six)  hours as needed. (Patient not taking: Reported on 06/04/2017) 15 tablet 0  . trolamine salicylate (ASPERCREME) 10 % cream Apply 1 application topically as needed for muscle pain.     No current facility-administered medications for this visit.     Review of Systems Review of Systems FH- + mat GM with ovarian cancer, no gyn/colon cancer She had her hysterectomy in [place] for precancer cervical cells  Blood pressure (!) 161/77, pulse 88, weight 220 lb (99.8 kg), last menstrual period 10/11/2015.  Physical Exam Physical Exam  Pleasant lady Breathing, conversing, and ambulating normally Well nourished, well hydrated White female, no apparent distress   Data Reviewed IMPRESSION: 1. Suspected large peritoneal inclusion cyst in the left adnexa as detailed. MRI of the pelvis without and with IV contrast is recommended for further characterization of this process, which can be performed on a short term outpatient basis. Gyn consultation advised. 2. No evidence of adnexal torsion. 3. No abnormal findings at the vaginal cuff.   Assessment    Left ovarian cyst, possible inclusion cyst  untreated htn  Plan   CA-125, pelvic MRI Refer to community health Mammogram scholarship She declines a flu vaccine.        [name] 06/04/2017, 9:44 AM

## 2017-06-04 NOTE — Progress Notes (Signed)
Pt stated went to ED 05/15/2017 for lower abdominal pain

## 2017-06-05 LAB — CA 125: Cancer Antigen (CA) 125: 2.9 U/mL (ref 0.0–38.1)

## 2017-06-10 ENCOUNTER — Ambulatory Visit (HOSPITAL_COMMUNITY)
Admission: RE | Admit: 2017-06-10 | Discharge: 2017-06-10 | Disposition: A | Discharge status: Home / Self Care | Payer: Medicaid Other | Source: Ambulatory Visit | Attending: Obstetrics & Gynecology | Admitting: Obstetrics & Gynecology

## 2017-06-10 DIAGNOSIS — N949 Unspecified condition associated with female genital organs and menstrual cycle: Secondary | ICD-10-CM | POA: Insufficient documentation

## 2017-06-10 DIAGNOSIS — R188 Other ascites: Secondary | ICD-10-CM | POA: Insufficient documentation

## 2017-06-10 DIAGNOSIS — Z90710 Acquired absence of both cervix and uterus: Secondary | ICD-10-CM | POA: Insufficient documentation

## 2017-06-10 MED ORDER — GADOBENATE DIMEGLUMINE 529 MG/ML IV SOLN
20.0000 mL | Freq: Once | INTRAVENOUS | Status: AC | PRN
  Administered 2017-06-10: 20 mL via INTRAVENOUS

## 2017-06-12 ENCOUNTER — Encounter: Payer: Self-pay | Admitting: Obstetrics & Gynecology

## 2017-06-25 ENCOUNTER — Ambulatory Visit: Payer: Medicaid Other | Admitting: Obstetrics & Gynecology

## 2017-09-04 ENCOUNTER — Emergency Department (HOSPITAL_COMMUNITY)
Admission: EM | Admit: 2017-09-04 | Discharge: 2017-09-05 | Disposition: A | Discharge status: Other Acute Inpatient Hospital | Payer: Medicaid Other | Attending: Emergency Medicine | Admitting: Emergency Medicine

## 2017-09-04 ENCOUNTER — Other Ambulatory Visit: Payer: Self-pay

## 2017-09-04 ENCOUNTER — Encounter (HOSPITAL_COMMUNITY): Payer: Self-pay

## 2017-09-04 DIAGNOSIS — F332 Major depressive disorder, recurrent severe without psychotic features: Secondary | ICD-10-CM | POA: Insufficient documentation

## 2017-09-04 DIAGNOSIS — R45851 Suicidal ideations: Secondary | ICD-10-CM | POA: Insufficient documentation

## 2017-09-04 DIAGNOSIS — F17200 Nicotine dependence, unspecified, uncomplicated: Secondary | ICD-10-CM | POA: Insufficient documentation

## 2017-09-04 LAB — CBC WITH DIFFERENTIAL/PLATELET
BASOS ABS: 0 10*3/uL (ref 0.0–0.1)
Basophils Relative: 0 %
EOS PCT: 1 %
Eosinophils Absolute: 0.1 10*3/uL (ref 0.0–0.7)
HEMATOCRIT: 41.2 % (ref 36.0–46.0)
Hemoglobin: 13.8 g/dL (ref 12.0–15.0)
LYMPHS ABS: 2.4 10*3/uL (ref 0.7–4.0)
LYMPHS PCT: 32 %
MCH: 32.5 pg (ref 26.0–34.0)
MCHC: 33.5 g/dL (ref 30.0–36.0)
MCV: 97.2 fL (ref 78.0–100.0)
MONO ABS: 0.5 10*3/uL (ref 0.1–1.0)
Monocytes Relative: 6 %
NEUTROS ABS: 4.5 10*3/uL (ref 1.7–7.7)
Neutrophils Relative %: 61 %
PLATELETS: 278 10*3/uL (ref 150–400)
RBC: 4.24 MIL/uL (ref 3.87–5.11)
RDW: 13.4 % (ref 11.5–15.5)
WBC: 7.4 10*3/uL (ref 4.0–10.5)

## 2017-09-04 LAB — RAPID URINE DRUG SCREEN, HOSP PERFORMED
AMPHETAMINES: NOT DETECTED
BENZODIAZEPINES: NOT DETECTED
Barbiturates: NOT DETECTED
COCAINE: NOT DETECTED
OPIATES: NOT DETECTED
TETRAHYDROCANNABINOL: POSITIVE — AB

## 2017-09-04 LAB — BASIC METABOLIC PANEL
ANION GAP: 11 (ref 5–15)
BUN: 10 mg/dL (ref 6–20)
CHLORIDE: 103 mmol/L (ref 101–111)
CO2: 23 mmol/L (ref 22–32)
Calcium: 9 mg/dL (ref 8.9–10.3)
Creatinine, Ser: 0.93 mg/dL (ref 0.44–1.00)
GFR calc Af Amer: >60 mL/min (ref >60)
GLUCOSE: 94 mg/dL (ref 65–99)
POTASSIUM: 3.7 mmol/L (ref 3.5–5.1)
Sodium: 137 mmol/L (ref 135–145)

## 2017-09-04 LAB — ACETAMINOPHEN LEVEL: Acetaminophen (Tylenol), Serum: <10 ug/mL — ABNORMAL LOW (ref 10–30)

## 2017-09-04 LAB — I-STAT BETA HCG BLOOD, ED (MC, WL, AP ONLY): I-stat hCG, quantitative: <5 m[IU]/mL (ref <5)

## 2017-09-04 LAB — SALICYLATE LEVEL: Salicylate Lvl: <7 mg/dL (ref 2.8–30.0)

## 2017-09-04 LAB — ETHANOL

## 2017-09-04 MED ORDER — AMLODIPINE BESYLATE 5 MG PO TABS
2.5000 mg | ORAL_TABLET | Freq: Every day | ORAL | Status: DC
Start: 2017-09-04 — End: 2017-09-05
  Administered 2017-09-04: 2.5 mg via ORAL
  Filled 2017-09-04: qty 1

## 2017-09-04 NOTE — ED Notes (Signed)
Dr. Rodena Medin notified of patient's BP.  BP medication given per order.

## 2017-09-04 NOTE — ED Notes (Signed)
Labs recollected

## 2017-09-04 NOTE — ED Notes (Signed)
Patient belongings placed in locker # 26, including cell phone.

## 2017-09-04 NOTE — ED Triage Notes (Signed)
Pt reports depression, hopelessness, and SI without a plan. She states that she has been depressed for about 2 months. She cites increased stress (no job, no home, problems with kids and relationship.) SI started about 1 week ago. She endorses smoking marijuana and cigarettes and drinking occasional alcohol. She called the crisis hotline and was brought here voluntarily with [employer].

## 2017-09-04 NOTE — ED Notes (Signed)
One large, blue rolling suitcase, labelled and placed behind nursing station.

## 2017-09-04 NOTE — ED Notes (Signed)
Patient given urine specimen cup and given instructions for collection. Pt verbalizes an understanding.

## 2017-09-04 NOTE — ED Provider Notes (Signed)
[facility]-EMERGENCY DEPT Provider Note   CSN: [id number] Arrival date & time: 09/04/17  1705     History   Chief Complaint Chief Complaint  Patient presents with  . Suicidal    HPI Brandy Holmes is a 47 y.o. female.  47 year old female with prior history of arthritis and hypertension presents with complaint of suicidal ideation.  Patient reports that she has been feeling more depressed over the last 1 to 2 weeks.  She has begun to have thoughts of self-harm.  She has no specific plan at this time.  She reports multiple stressors including difficulties with her family, not having a place to stay, and feeling like she has no resources to help her.  She reports a distant history of suicide attempts in the past.  She arrives voluntarily.   The history is provided by the patient.  Mental Health Problem  Presenting symptoms: suicidal thoughts   Degree of incapacity (severity):  Moderate Onset quality:  Gradual Duration:  2 weeks Timing:  Constant Progression:  Worsening Chronicity:  New Treatment compliance:  Untreated Relieved by:  Nothing Worsened by:  Nothing Ineffective treatments:  None tried   Past Medical History:  Diagnosis Date  . Arthritis   . Hypertension     There are no active problems to display for this patient.   Past Surgical History:  Procedure Laterality Date  . ABDOMINAL HYSTERECTOMY    . BREAST SURGERY    . CHOLECYSTECTOMY       OB History   None      Home Medications    Prior to Admission medications   Medication Sig Start Date End Date Taking? Authorizing Provider  amLODipine (NORVASC) 2.5 MG tablet Take 1 tablet (2.5 mg total) by mouth daily. Patient not taking: Reported on 06/10/2016 04/30/16   Street, Sula, PA-C  cyclobenzaprine (FLEXERIL) 10 MG tablet Take 1 tablet (10 mg total) by mouth 2 (two) times daily as needed for muscle spasms. Patient not taking: Reported on 05/15/2017 04/23/17   Alvira Monday, MD   hydrALAZINE (APRESOLINE) 25 MG tablet Take 1 tablet (25 mg total) by mouth 3 (three) times daily. Patient not taking: Reported on 04/23/2017 06/06/16   Raeford Razor, MD  ibuprofen (ADVIL,MOTRIN) 800 MG tablet Take 1 tablet (800 mg total) by mouth every 8 (eight) hours as needed. 05/15/17   Long, Arlyss Repress, MD  meclizine (ANTIVERT) 25 MG tablet Take 1 tablet (25 mg total) by mouth 3 (three) times daily as needed for dizziness. Patient not taking: Reported on 04/23/2017 06/20/16   Gilda Crease, MD  naproxen sodium (ALEVE) 220 MG tablet Take 220-440 mg by mouth 2 (two) times daily as needed (pain).    [provider]  traMADol (ULTRAM) 50 MG tablet Take 1 tablet (50 mg total) by mouth every 6 (six) hours as needed. Patient not taking: Reported on 06/04/2017 05/15/17   Long, Arlyss Repress, MD  trolamine salicylate (ASPERCREME) 10 % cream Apply 1 application topically as needed for muscle pain.    [provider]    Family History History reviewed. No pertinent family history.  Social History Social History   Tobacco Use  . Smoking status: Current Every Day Smoker    Packs/day: 0.50  . Smokeless tobacco: Never Used  Substance Use Topics  . Alcohol use: No  . Drug use: Yes    Types: Marijuana     Allergies   Patient has no known allergies.   Review of Systems Review  of Systems  Psychiatric/Behavioral: Positive for suicidal ideas.  All other systems reviewed and are negative.    Physical Exam Updated Vital Signs BP (!) 191/102 (BP Location: Right Arm) Comment: Pt states that she is dx'd with HTN, she does not take her medication. She verbalized understanding about the improtance of her medication and is willing to take her BP meds here.  Pulse 71   Temp 98.8 F (37.1 C) (Oral)   Resp 16   LMP 10/11/2015   SpO2 100%   Physical Exam  Constitutional: She is oriented to person, place, and time. She appears well-developed and well-nourished. No distress.    HENT:  Head: Normocephalic and atraumatic.  Mouth/Throat: Oropharynx is clear and moist.  Eyes: Pupils are equal, round, and reactive to light. Conjunctivae and EOM are normal.  Neck: Normal range of motion. Neck supple.  Cardiovascular: Normal rate, regular rhythm and normal heart sounds.  Pulmonary/Chest: Effort normal and breath sounds normal. No respiratory distress.  Abdominal: Soft. She exhibits no distension. There is no tenderness.  Musculoskeletal: Normal range of motion. She exhibits no edema or deformity.  Neurological: She is alert and oriented to person, place, and time.  Skin: Skin is warm and dry.  Psychiatric: She has a normal mood and affect.  Expresses SI without plan   Nursing note and vitals reviewed.    ED Treatments / Results  Labs (all labs ordered are listed, but only abnormal results are displayed) Labs Reviewed  RAPID URINE DRUG SCREEN, HOSP PERFORMED - Abnormal; Notable for the following components:      Result Value   Tetrahydrocannabinol POSITIVE (*)    All other components within normal limits  BASIC METABOLIC PANEL  CBC WITH DIFFERENTIAL/PLATELET  ETHANOL  SALICYLATE LEVEL  ACETAMINOPHEN LEVEL  ACETAMINOPHEN LEVEL  SALICYLATE LEVEL  ETHANOL  I-STAT BETA HCG BLOOD, ED ([facility], [facility], [facility] ONLY)    EKG None  Radiology No results found.  Procedures Procedures (including critical care time)  Medications Ordered in ED Medications - No data to display   Initial Impression / Assessment and Plan / ED Course  I have reviewed the triage vital signs and the nursing notes.  Pertinent labs & imaging results that were available during my care of the patient were reviewed by me and considered in my medical decision making (see chart for details).     MDM  Screen complete  Patient is presenting with Suicidal ideation.  Screening labs do not suggest an acute medical process.  Patient requires evaluation by TTS/psychiatry.  Final disposition is  dependent upon their assessment.  Patient is medically clear at this time for psychiatric evaluation.  Final Clinical Impressions(s) / ED Diagnoses   Final diagnoses:  Suicidal ideation    ED Discharge Orders    None       Wynetta Fines, MD 09/04/17 2245

## 2017-09-04 NOTE — ED Notes (Signed)
Patient moved to room 43.  Belongings placed in dayroom and locker #43.  Patient alert and oriented, endorses SI denies HI or AVH.  Patient requesting a sandwich as she reports she has not eaten since yesterday.

## 2017-09-05 ENCOUNTER — Inpatient Hospital Stay (HOSPITAL_COMMUNITY)
Admission: AD | Admit: 2017-09-05 | Discharge: 2017-09-10 | DRG: 885 | Disposition: A | Discharge status: Home / Self Care | Payer: No Typology Code available for payment source | Source: Intra-hospital | Attending: Psychiatry | Admitting: Psychiatry

## 2017-09-05 ENCOUNTER — Encounter (HOSPITAL_COMMUNITY): Payer: Self-pay

## 2017-09-05 DIAGNOSIS — G4700 Insomnia, unspecified: Secondary | ICD-10-CM | POA: Diagnosis not present

## 2017-09-05 DIAGNOSIS — R4587 Impulsiveness: Secondary | ICD-10-CM | POA: Diagnosis not present

## 2017-09-05 DIAGNOSIS — I10 Essential (primary) hypertension: Secondary | ICD-10-CM | POA: Diagnosis not present

## 2017-09-05 DIAGNOSIS — Z91410 Personal history of adult physical and sexual abuse: Secondary | ICD-10-CM | POA: Diagnosis not present

## 2017-09-05 DIAGNOSIS — R45851 Suicidal ideations: Secondary | ICD-10-CM

## 2017-09-05 DIAGNOSIS — F17210 Nicotine dependence, cigarettes, uncomplicated: Secondary | ICD-10-CM | POA: Diagnosis present

## 2017-09-05 DIAGNOSIS — F4310 Post-traumatic stress disorder, unspecified: Secondary | ICD-10-CM

## 2017-09-05 DIAGNOSIS — F1099 Alcohol use, unspecified with unspecified alcohol-induced disorder: Secondary | ICD-10-CM | POA: Diagnosis not present

## 2017-09-05 DIAGNOSIS — Z90710 Acquired absence of both cervix and uterus: Secondary | ICD-10-CM | POA: Diagnosis not present

## 2017-09-05 DIAGNOSIS — F1210 Cannabis abuse, uncomplicated: Secondary | ICD-10-CM | POA: Diagnosis present

## 2017-09-05 DIAGNOSIS — G8929 Other chronic pain: Secondary | ICD-10-CM | POA: Diagnosis not present

## 2017-09-05 DIAGNOSIS — J3489 Other specified disorders of nose and nasal sinuses: Secondary | ICD-10-CM | POA: Diagnosis not present

## 2017-09-05 DIAGNOSIS — Z91419 Personal history of unspecified adult abuse: Secondary | ICD-10-CM | POA: Diagnosis not present

## 2017-09-05 DIAGNOSIS — Z590 Homelessness: Secondary | ICD-10-CM | POA: Diagnosis not present

## 2017-09-05 DIAGNOSIS — Z818 Family history of other mental and behavioral disorders: Secondary | ICD-10-CM | POA: Diagnosis not present

## 2017-09-05 DIAGNOSIS — F332 Major depressive disorder, recurrent severe without psychotic features: Secondary | ICD-10-CM | POA: Diagnosis not present

## 2017-09-05 DIAGNOSIS — K0889 Other specified disorders of teeth and supporting structures: Secondary | ICD-10-CM | POA: Diagnosis not present

## 2017-09-05 DIAGNOSIS — F1290 Cannabis use, unspecified, uncomplicated: Secondary | ICD-10-CM | POA: Diagnosis not present

## 2017-09-05 DIAGNOSIS — R450 Nervousness: Secondary | ICD-10-CM | POA: Diagnosis not present

## 2017-09-05 DIAGNOSIS — F419 Anxiety disorder, unspecified: Secondary | ICD-10-CM | POA: Diagnosis not present

## 2017-09-05 DIAGNOSIS — Z79899 Other long term (current) drug therapy: Secondary | ICD-10-CM | POA: Diagnosis not present

## 2017-09-05 DIAGNOSIS — F1020 Alcohol dependence, uncomplicated: Secondary | ICD-10-CM

## 2017-09-05 DIAGNOSIS — R451 Restlessness and agitation: Secondary | ICD-10-CM | POA: Diagnosis not present

## 2017-09-05 DIAGNOSIS — K1379 Other lesions of oral mucosa: Secondary | ICD-10-CM | POA: Diagnosis not present

## 2017-09-05 LAB — TSH: TSH: 1.765 u[IU]/mL (ref 0.350–4.500)

## 2017-09-05 LAB — LIPID PANEL
Cholesterol: 203 mg/dL — ABNORMAL HIGH (ref 0–200)
HDL: 45 mg/dL (ref >40)
LDL CALC: 148 mg/dL — AB (ref 0–99)
TRIGLYCERIDES: 48 mg/dL (ref <150)
Total CHOL/HDL Ratio: 4.5 RATIO
VLDL: 10 mg/dL (ref 0–40)

## 2017-09-05 LAB — HEMOGLOBIN A1C
HEMOGLOBIN A1C: 5 % (ref 4.8–5.6)
Mean Plasma Glucose: 96.8 mg/dL

## 2017-09-05 MED ORDER — ALUM & MAG HYDROXIDE-SIMETH 200-200-20 MG/5ML PO SUSP: 30.0000 mL | ORAL | Status: DC | PRN

## 2017-09-05 MED ORDER — TRAZODONE HCL 100 MG PO TABS
100.0000 mg | ORAL_TABLET | Freq: Every evening | ORAL | Status: DC | PRN
  Filled 2017-09-05: qty 1
  Filled 2017-09-05: qty 7

## 2017-09-05 MED ORDER — HYDROXYZINE HCL 25 MG PO TABS
25.0000 mg | ORAL_TABLET | Freq: Three times a day (TID) | ORAL | Status: DC | PRN
  Filled 2017-09-05: qty 10
  Filled 2017-09-05: qty 1

## 2017-09-05 MED ORDER — ACETAMINOPHEN 325 MG PO TABS: 650.0000 mg | ORAL_TABLET | Freq: Four times a day (QID) | ORAL | Status: DC | PRN

## 2017-09-05 MED ORDER — SERTRALINE HCL 25 MG PO TABS
25.0000 mg | ORAL_TABLET | Freq: Every day | ORAL | Status: DC
  Administered 2017-09-05 – 2017-09-06 (×2): 25 mg via ORAL
  Filled 2017-09-05 (×6): qty 1

## 2017-09-05 MED ORDER — MAGNESIUM HYDROXIDE 400 MG/5ML PO SUSP: 30.0000 mL | Freq: Every day | ORAL | Status: DC | PRN

## 2017-09-05 NOTE — [facility] Group Notes (Signed)
[facility] LCSW Group Therapy Note  Date/Time: 09/05/17, 1315  Type of Therapy/Topic:  Group Therapy:  Feelings about Diagnosis  Participation Level:  None   Mood: unsure   Description of Group:    This group will allow patients to explore their thoughts and feelings about diagnoses they have received. Patients will be guided to explore their level of understanding and acceptance of these diagnoses. Facilitator will encourage patients to process their thoughts and feelings about the reactions of others to their diagnosis, and will guide patients in identifying ways to discuss their diagnosis with significant others in their lives. This group will be process-oriented, with patients participating in exploration of their own experiences as well as giving and receiving support and challenge from other group members.   Therapeutic Goals: 1. Patient will demonstrate understanding of diagnosis as evidence by identifying two or more symptoms of the disorder:  2. Patient will be able to express two feelings regarding the diagnosis 3. Patient will demonstrate ability to communicate their needs through discussion and/or role plays  Summary of Patient Progress:Pt appeared attentive today during the group but did not participate in the group discussion.        Therapeutic Modalities:   Cognitive Behavioral Therapy Brief Therapy Feelings Identification   Daleen Squibb, LCSW
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
## 2017-09-05 NOTE — ED Provider Notes (Signed)
1:30 AM  Pt accepted to [facility] per TTS.  Can come over after 2:30 am.  Medically cleared.   Cabe Lashley, Layla Maw, DO 09/05/17 0129

## 2017-09-05 NOTE — Tx Team (Signed)
Initial Treatment Plan 09/05/2017 5:13 AM [name] ZOX:[id number]    PATIENT STRESSORS: Financial difficulties Marital or family conflict Medication change or noncompliance Substance abuse   PATIENT STRENGTHS: Ability for insight Average or above average intelligence Communication skills General fund of knowledge   PATIENT IDENTIFIED PROBLEMS: "Depression"  "I wish I was dead"   Finances   No support                 DISCHARGE CRITERIA:  Ability to meet basic life and health needs Adequate post-discharge living arrangements Improved stabilization in mood, thinking, and/or behavior Verbal commitment to aftercare and medication compliance  PRELIMINARY DISCHARGE PLAN: Attend PHP/IOP Outpatient therapy Placement in alternative living arrangements  PATIENT/FAMILY INVOLVEMENT: This treatment plan has been presented to and reviewed with the patient, Brandy Holmes.The patient have been given the opportunity to ask questions and make suggestions.  Tyrone Apple, RN 09/05/2017, 5:13 AM
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
## 2017-09-05 NOTE — Progress Notes (Signed)
Pt observed in dayroom, attending wrap-up group. Pt appears flat/depressed in affect and mood. Pt denies SI/HI/AVH at this time. Rates pain 6/10; shoulders. PRN tylenol offered and Pt refused. Heat pack given with minimal relief. Pt states she doesn't like to take medications. Pt is minimal with interaction. Continue with POC.

## 2017-09-05 NOTE — ED Notes (Addendum)
Report called to RN [place], [facility] rm [phone].  Pending [facility] transport on Days[facility].

## 2017-09-05 NOTE — Plan of Care (Signed)
Patient presents depressed, anxious, cooperative. Patient feels overwhelmed. Denied having suicidal thoughts this morning.

## 2017-09-05 NOTE — Progress Notes (Signed)
TTS consulted with Malachy Chamber, NP who recommend inpt treatment. Pt's nurse Joanie Coddington, RN has been advised of the disposition. EDP Dr. Elesa Massed, MD has also been advised.   Pt accepted to [facility] 404-2. Attending provider will be Dr. Jama Flavors, MD. Call to report [phone].   Princess Bruins, MSW, LCSW Therapeutic Triage Specialist  [phone]
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
## 2017-09-05 NOTE — BHH Counselor (Signed)
Support paperwork completed and faxed to [facility].   Redmond Pulling, MS, [facility], [facility] Triage Specialist [phone]

## 2017-09-05 NOTE — Progress Notes (Signed)
Recreation Therapy Notes  Date: 5.10.19 Time: 0930 Location: 300 Hall Dayroom  Group Topic: Stress Management  Goal Area(s) Addresses:  Patient will verbalize importance of using healthy stress management.  Patient will identify positive emotions associated with healthy stress management.   Behavioral Response: Engaged  Intervention: Stress Management  Activity : Body Scan Meditation.  LRT played meditation that allowed patients to take note of any sensations and feelings they may have been experiencing throughout their bodies.  Patients were to follow along as meditation played.  Education:  Stress Management, Discharge Planning.   Education Outcome: Acknowledges edcuation/In group clarification offered/Needs additional education  Clinical Observations/Feedback: Pt attended group.    Omunique Pederson, LRT/CTRS         Jahmai Finelli A 09/05/2017 12:35 PM 

## 2017-09-05 NOTE — Progress Notes (Addendum)
D: Patient presents calm and cooperative. Patient presents feeling overwhelmed and depressed. Patient reports her sleep was "fine". Did not fill out a self-inventory sheet. Patient denies SI/HI/AVH. Behavior on unit is appropriate. A: Patient checked q15 min, and checks reviewed. Reviewed medication changes with patient and educated on side effects. Educated patient on importance of attending group therapy sessions and educated on several coping skills. Encouarged participation in milieu through recreation therapy and attending meals with peers.  R: Patient receptive to education on medications, and is medication compliant. Patient attending all group therapy, recreation therapy sessions, and cafeteria with peers. Patient did not set a goal for today. Patient contracts for safety on the unit.
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
## 2017-09-05 NOTE — Tx Team (Signed)
Interdisciplinary Treatment and Diagnostic Plan Update  09/05/2017 Time of Session: 9:30am [name] MRN: [id number]  Principal Diagnosis: <principal problem not specified>  Secondary Diagnoses: Active Problems:   MDD (major depressive disorder), recurrent episode, severe (HCC)   Current Medications:  Current Facility-Administered Medications  Medication Dose Route Frequency Provider Last Rate Last Dose  . acetaminophen (TYLENOL) tablet 650 mg  650 mg Oral Q6H PRN Nanci Pina, FNP      . alum & mag hydroxide-simeth (MAALOX/MYLANTA) 200-200-20 MG/5ML suspension 30 mL  30 mL Oral Q4H PRN Nanci Pina, FNP      . hydrOXYzine (ATARAX/VISTARIL) tablet 25 mg  25 mg Oral TID PRN Nanci Pina, FNP      . magnesium hydroxide (MILK OF MAGNESIA) suspension 30 mL  30 mL Oral Daily PRN Nanci Pina, FNP      . sertraline (ZOLOFT) tablet 25 mg  25 mg Oral Daily Sharma Covert, MD      . traZODone (DESYREL) tablet 100 mg  100 mg Oral QHS PRN Nanci Pina, FNP       PTA Medications: Medications Prior to Admission  Medication Sig Dispense Refill Last Dose  . amLODipine (NORVASC) 2.5 MG tablet Take 1 tablet (2.5 mg total) by mouth daily. (Patient not taking: Reported on 06/10/2016) 30 tablet 0 Not Taking  . cyclobenzaprine (FLEXERIL) 10 MG tablet Take 1 tablet (10 mg total) by mouth 2 (two) times daily as needed for muscle spasms. (Patient not taking: Reported on 05/15/2017) 20 tablet 0 Not Taking  . hydrALAZINE (APRESOLINE) 25 MG tablet Take 1 tablet (25 mg total) by mouth 3 (three) times daily. (Patient not taking: Reported on 04/23/2017) 90 tablet 2 Not Taking  . ibuprofen (ADVIL,MOTRIN) 200 MG tablet Take 200-800 mg by mouth every 6 (six) hours as needed for moderate pain (tooth pain).   Past Week at Unknown time  . meclizine (ANTIVERT) 25 MG tablet Take 1 tablet (25 mg total) by mouth 3 (three) times daily as needed for dizziness. (Patient not taking: Reported on 04/23/2017)  30 tablet 0 Not Taking  . naproxen sodium (ALEVE) 220 MG tablet Take 220-440 mg by mouth 2 (two) times daily as needed (pain).   Past Week at Unknown time  . traMADol (ULTRAM) 50 MG tablet Take 1 tablet (50 mg total) by mouth every 6 (six) hours as needed. (Patient not taking: Reported on 06/04/2017) 15 tablet 0 Completed Course at Unknown time    Patient Stressors: Financial difficulties Marital or family conflict Medication change or noncompliance Substance abuse  Patient Strengths: Ability for insight Average or above average intelligence Communication skills General fund of knowledge  Treatment Modalities: Medication Management, Group therapy, Case management,  1 to 1 session with clinician, Psychoeducation, Recreational therapy.   Physician Treatment Plan for Primary Diagnosis: <principal problem not specified> Long Term Goal(s):     Short Term Goals:    Medication Management: Evaluate patient's response, side effects, and tolerance of medication regimen.  Therapeutic Interventions: 1 to 1 sessions, Unit Group sessions and Medication administration.  Evaluation of Outcomes: Not Met  Physician Treatment Plan for Secondary Diagnosis: Active Problems:   MDD (major depressive disorder), recurrent episode, severe ([place])  Long Term Goal(s):     Short Term Goals:       Medication Management: Evaluate patient's response, side effects, and tolerance of medication regimen.  Therapeutic Interventions: 1 to 1 sessions, Unit Group sessions and Medication administration.  Evaluation of Outcomes: Not Met  RN Treatment Plan for Primary Diagnosis: <principal problem not specified> Long Term Goal(s): Knowledge of disease and therapeutic regimen to maintain health will improve  Short Term Goals: Ability to remain free from injury will improve, Ability to verbalize frustration and anger appropriately will improve, Ability to demonstrate self-control, Ability to participate in decision  making will improve, Ability to verbalize feelings will improve, Ability to disclose and discuss suicidal ideas, Ability to identify and develop effective coping behaviors will improve and Compliance with prescribed medications will improve  Medication Management: RN will administer medications as ordered by provider, will assess and evaluate patient's response and provide education to patient for prescribed medication. RN will report any adverse and/or side effects to prescribing provider.  Therapeutic Interventions: 1 on 1 counseling sessions, Psychoeducation, Medication administration, Evaluate responses to treatment, Monitor vital signs and CBGs as ordered, Perform/monitor CIWA, COWS, AIMS and Fall Risk screenings as ordered, Perform wound care treatments as ordered.  Evaluation of Outcomes: Not Met   LCSW Treatment Plan for Primary Diagnosis: <principal problem not specified> Long Term Goal(s): Safe transition to appropriate next level of care at discharge, Engage patient in therapeutic group addressing interpersonal concerns.  Short Term Goals: Engage patient in aftercare planning with referrals and resources, Increase social support, Increase ability to appropriately verbalize feelings, Increase emotional regulation, Facilitate acceptance of mental health diagnosis and concerns, Facilitate patient progression through stages of change regarding substance use diagnoses and concerns, Identify triggers associated with mental health/substance abuse issues and Increase skills for wellness and recovery  Therapeutic Interventions: Assess for all discharge needs, 1 to 1 time with Social worker, Explore available resources and support systems, Assess for adequacy in community support network, Educate family and significant other(s) on suicide prevention, Complete Psychosocial Assessment, Interpersonal group therapy.  Evaluation of Outcomes: Not Met   Progress in Treatment: Attending groups: No. New  to unit Participating in groups: No. Taking medication as prescribed: No. Toleration medication: No. Family/Significant other contact made: No, will contact:  CSW will continue to assess for an appropriate discharge plan Patient understands diagnosis: Yes. Discussing patient identified problems/goals with staff: Yes. Medical problems stabilized or resolved: Yes. Denies suicidal/homicidal ideation: Yes. Issues/concerns per patient self-inventory: No. Other:   New problem(s) identified: None  New Short Term/Long Term Goal(s):medication stabilization, elimination of SI thoughts, development of comprehensive mental wellness plan.   Patient Goals: "I want to learn more coping skills and learn how to effectively redirect my thoughts"  Discharge Plan or Barriers: CSW will continue to assess for an appropriate discharge plan.   Reason for Continuation of Hospitalization: Anxiety Depression Medication stabilization Suicidal ideation  Estimated Length of Stay: Wednesday, 09/10/17  Attendees: Patient: Brandy Holmes 09/05/2017 8:45 AM  Physician: Dr. Myles Lipps, MD 09/05/2017 8:45 AM  Nursing: Chong Sicilian, RN 09/05/2017 8:45 AM  RN Care Manager: Rhunette Croft 09/05/2017 8:45 AM  Social Worker: Radonna Ricker, [place] 09/05/2017 8:45 AM  Recreational Therapist: X 09/05/2017 8:45 AM  Other: X 09/05/2017 8:45 AM  Other: X 09/05/2017 8:45 AM  Other:x 09/05/2017 8:45 AM    Scribe for Treatment Team: Marylee Floras, [place] 09/05/2017 8:45 AM
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
## 2017-09-05 NOTE — Progress Notes (Signed)
TTS spoke with [facility] who states the pt's bed is now going to be available after 8am instead of 3:30am. Kathryne Hitch Hedy Jacob, RN contacted and notified of bed availability after 8am.   Princess Bruins, MSW, LCSW Therapeutic Triage Specialist  [phone]

## 2017-09-06 DIAGNOSIS — K1379 Other lesions of oral mucosa: Secondary | ICD-10-CM

## 2017-09-06 MED ORDER — SERTRALINE HCL 50 MG PO TABS
50.0000 mg | ORAL_TABLET | Freq: Every day | ORAL | Status: DC
  Administered 2017-09-07: 50 mg via ORAL
  Filled 2017-09-06 (×4): qty 1

## 2017-09-06 MED ORDER — IBUPROFEN 400 MG PO TABS
400.0000 mg | ORAL_TABLET | Freq: Four times a day (QID) | ORAL | Status: DC | PRN
  Administered 2017-09-06: 400 mg via ORAL
  Filled 2017-09-06: qty 1

## 2017-09-06 MED ORDER — BENZOCAINE 10 % MT GEL
Freq: Four times a day (QID) | OROMUCOSAL | Status: DC | PRN
  Administered 2017-09-06: 21:00:00 via OROMUCOSAL
  Filled 2017-09-06: qty 9.4

## 2017-09-06 NOTE — Progress Notes (Addendum)
[facility] MD Progress Note  09/06/2017 5:01 PM Brandy Holmes  MRN:  [id number] Subjective: Patient reports her mood is "a little better", but remains depressed and ruminative about her current psychosocial stressors.  Denies suicidal ideations today.  Denies medication side effects thus far. Objective I have reviewed chart notes and have met with patient 47-year-old female, history of depression and PTSD.  Presented to the ED on May 9 with worsening depression and suicidal ideations.  Reports significant stressors, including recently losing her job, currently being homeless, and recently returning from out of state where she had traveled to help out an adult daughter who is in an abusive relationship. At this time reports partial improvement, but does endorse ongoing depression and presents with a constricted affect, although does smile briefly at times. She denies suicidal ideations at this time, and contracts for safety on the unit.  No psychotic symptoms. Thus far tolerating Zoloft trial well. Chart notes indicate that patient presents with a flat, depressed affect, but has denied suicidal ideations. Limited group participation at this time, cooperative on approach.  Principal Problem: MDD Diagnosis:   Patient Active Problem List   Diagnosis Date Noted  . MDD (major depressive disorder), recurrent episode, severe (HCC) [F33.2] 09/05/2017   Total Time spent with patient: 20 minutes  Past Psychiatric History:   Past Medical History:  Past Medical History:  Diagnosis Date  . Arthritis   . Hypertension     Past Surgical History:  Procedure Laterality Date  . ABDOMINAL HYSTERECTOMY    . BREAST SURGERY    . CHOLECYSTECTOMY     Family History: History reviewed. No pertinent family history. Family Psychiatric  History: Social History:  Social History   Substance and Sexual Activity  Alcohol Use Yes  . Alcohol/week: 1.8 - 2.4 oz  . Types: 3 - 4 Shots of liquor per week   Comment:  ocassionally per Pt      Social History   Substance and Sexual Activity  Drug Use Yes  . Types: Marijuana    Social History   Socioeconomic History  . Marital status: Single    Spouse name: Not on file  . Number of children: Not on file  . Years of education: Not on file  . Highest education level: Not on file  Occupational History  . Not on file  Social Needs  . Financial resource strain: Not on file  . Food insecurity:    Worry: Not on file    Inability: Not on file  . Transportation needs:    Medical: Not on file    Non-medical: Not on file  Tobacco Use  . Smoking status: Current Every Day Smoker    Packs/day: 0.50  . Smokeless tobacco: Never Used  Substance and Sexual Activity  . Alcohol use: Yes    Alcohol/week: 1.8 - 2.4 oz    Types: 3 - 4 Shots of liquor per week    Comment: ocassionally per Pt   . Drug use: Yes    Types: Marijuana  . Sexual activity: Yes    Birth control/protection: None  Lifestyle  . Physical activity:    Days per week: Not on file    Minutes per session: Not on file  . Stress: Not on file  Relationships  . Social connections:    Talks on phone: Not on file    Gets together: Not on file    Attends religious service: Not on file    Active member of club or organization:   Not on file    Attends meetings of clubs or organizations: Not on file    Relationship status: Not on file  Other Topics Concern  . Not on file  Social History Narrative  . Not on file   Additional Social History:   Sleep: Fair  Appetite:  Fair  Current Medications: Current Facility-Administered Medications  Medication Dose Route Frequency Provider Last Rate Last Dose  . acetaminophen (TYLENOL) tablet 650 mg  650 mg Oral Q6H PRN Starkes, Takia S, FNP      . alum & mag hydroxide-simeth (MAALOX/MYLANTA) 200-200-20 MG/5ML suspension 30 mL  30 mL Oral Q4H PRN Starkes, Takia S, FNP      . hydrOXYzine (ATARAX/VISTARIL) tablet 25 mg  25 mg Oral TID PRN Starkes,  Takia S, FNP      . magnesium hydroxide (MILK OF MAGNESIA) suspension 30 mL  30 mL Oral Daily PRN Starkes, Takia S, FNP      . sertraline (ZOLOFT) tablet 25 mg  25 mg Oral Daily Clary, Greg Lawson, MD   25 mg at 09/06/17 0812  . traZODone (DESYREL) tablet 100 mg  100 mg Oral QHS PRN Starkes, Takia S, FNP        Lab Results:  Results for orders placed or performed during the hospital encounter of 09/05/17 (from the past 48 hour(s))  Lipid panel     Status: Abnormal   Collection Time: 09/05/17  6:22 AM  Result Value Ref Range   Cholesterol 203 (H) 0 - 200 mg/dL   Triglycerides 48 <150 mg/dL   HDL 45 >40 mg/dL   Total CHOL/HDL Ratio 4.5 RATIO   VLDL 10 0 - 40 mg/dL   LDL Cholesterol 148 (H) 0 - 99 mg/dL    Comment:        Total Cholesterol/HDL:CHD Risk Coronary Heart Disease Risk Table                     Men   Women  1/2 Average Risk   3.4   3.3  Average Risk       5.0   4.4  2 X Average Risk   9.6   7.1  3 X Average Risk  23.4   11.0        Use the calculated Patient Ratio above and the CHD Risk Table to determine the patient's CHD Risk.        ATP III CLASSIFICATION (LDL):  <100     mg/dL   Optimal  100-129  mg/dL   Near or Above                    Optimal  130-159  mg/dL   Borderline  160-189  mg/dL   High  >190     mg/dL   Very High Performed at [facility], [address] [place], [place] [address]   Hemoglobin A1c     Status: None   Collection Time: 09/05/17  6:22 AM  Result Value Ref Range   Hgb A1c MFr Bld 5.0 4.8 - 5.6 %    Comment: (NOTE) Pre diabetes:          5.7%-6.4% Diabetes:              >6.4% Glycemic control for   <7.0% adults with diabetes    Mean Plasma Glucose 96.8 mg/dL    Comment: Performed at [facility], [address] [place], [place] [address]  TSH       Status: None   Collection Time: 09/05/17  6:22 AM  Result Value Ref Range   TSH 1.765 0.350 - 4.500 uIU/mL    Comment: Performed by a 3rd Generation assay with  a functional sensitivity of <=0.01 uIU/mL. Performed at [facility], [place] [address] [place], [place] [address]     Blood Alcohol level:  Lab Results  Component Value Date   ETH <10 67/20/9470    Metabolic Disorder Labs: Lab Results  Component Value Date   HGBA1C 5.0 09/05/2017   MPG 96.8 09/05/2017   No results found for: PROLACTIN Lab Results  Component Value Date   CHOL 203 (H) 09/05/2017   TRIG 48 09/05/2017   HDL 45 09/05/2017   CHOLHDL 4.5 09/05/2017   VLDL 10 09/05/2017   LDLCALC 148 (H) 09/05/2017    Physical Findings: AIMS: Facial and Oral Movements Muscles of Facial Expression: None, normal Lips and Perioral Area: None, normal Jaw: None, normal Tongue: None, normal,Extremity Movements Upper (arms, wrists, hands, fingers): None, normal Lower (legs, knees, ankles, toes): None, normal, Trunk Movements Neck, shoulders, hips: None, normal, Overall Severity Severity of abnormal movements (highest score from questions above): None, normal Incapacitation due to abnormal movements: None, normal Patient's awareness of abnormal movements (rate only patient's report): No Awareness, Dental Status Current problems with teeth and/or dentures?: No Does patient usually wear dentures?: No  CIWA:  CIWA-Ar Total: 0 COWS:  COWS Total Score: 0  Musculoskeletal: Strength & Muscle Tone: within normal limits Gait & Station: normal Patient leans: N/A  Psychiatric Specialty Exam: Physical Exam  ROS denies headache, reports toothache, no chest pain, no shortness of breath, no vomiting, no fever, no chills  Blood pressure (!) 180/81, pulse (!) 57, temperature 98.4 F (36.9 C), temperature source Oral, resp. rate 16, height 5' 5" (1.651 m), weight 98.9 kg (218 lb), last menstrual period 10/11/2015.Body mass index is 36.28 kg/m.  General Appearance: Fairly Groomed  Eye Contact:  Fair  Speech:  Normal Rate  Volume:  Decreased  Mood:  Depressed  Affect:   Constricted but briefly reactive, smiles briefly at times  Thought Process:  Linear and Descriptions of Associations: Intact  Orientation:  Other:  Fully alert and attentive  Thought Content:  Denies hallucinations, no delusions, ruminative about stressors  Suicidal Thoughts:  No denies any current suicidal ideations, denies self-injurious ideations, contracts for safety on unit, denies violent or homicidal ideations  Homicidal Thoughts:  No  Memory:  Recent and remote grossly intact  Judgement:  Fair-improving  Insight:  Fair-improving  Psychomotor Activity:  Decreased  Concentration:  Concentration: Good and Attention Span: Good  Recall:  Good  Fund of Knowledge:  Good  Language:  Good  Akathisia:  Negative  Handed:  Right  AIMS (if indicated):     Assets:  Communication Skills Desire for Improvement Resilience  ADL's:  Intact  Cognition:  WNL  Sleep:  Number of Hours: 0(early am admission)   Assessment -47 year old female, presented to hospital for worsening depression and suicidal ideations in the context of significant psychosocial stressors.  Currently remains depressed with a constricted affect, but reports partial improvement compared to admission, and denies suicidal ideations.  Currently on Zoloft trial which she is tolerating well thus far.   Treatment Plan Summary: Daily contact with patient to assess and evaluate symptoms and progress in treatment, Medication management, Plan inpatient treatment and medications as below Encourage group and milieu participation to work on coping skills and symptom reduction Increase Zoloft to  50 mg QDAY for depression and anxiety Continue Vistaril 25 mgr TID for anxiety PRN Continue Trazodone 100 mgrs QHS for depression PRN Ibuprofen PRN/Orajel PRN for dental/ mouth pain.  Treatment working on discharge [place], MD 09/06/2017, 5:01 PM

## 2017-09-06 NOTE — [facility] Group Notes (Signed)
Adult Psychoeducational Group Note  Date:  09/06/2017 Time:  10:39 PM  Group Topic/Focus:  Wrap-Up Group:   The focus of this group is to help patients review their daily goal of treatment and discuss progress on daily workbooks.  Participation Level:  Active  Participation Quality:  Appropriate and Attentive  Affect:  Appropriate  Cognitive:  Alert and Appropriate  Insight: Appropriate and Good  Engagement in Group:  Engaged  Modes of Intervention:  Discussion and Education  Additional Comments:  Pt attended and participated in wrap up group this evening. Pt completed their goal of stress management and finding new coping skills. Pt rated their day a 7/10.    Chrisandra Netters 09/06/2017, 10:39 PM

## 2017-09-06 NOTE — Progress Notes (Signed)
D. Pt presents with an anxious affect and congruent behavior- friendly during interactions. Per pt's self inventory, pt rates her depression, hopelessness and anxiety a 4/10, 2/10 and 6/10, respectively. Pt currently denies SI/HI and AV hallucinations.Pt writes that her most important goal today is "coping/stress management". A. Labs and vitals monitored. Pt compliant with medications. Pt supported emotionally and encouraged to express concerns and ask questions.   R. Pt remains safe with 15 minute checks. Will continue POC.

## 2017-09-06 NOTE — [facility] Group Notes (Signed)
LCSW Group Therapy Note  09/06/2017   10:30--11:30am   Type of Therapy and Topic:  Group Therapy: Anger Cues and Responses  Participation Level:  Active   Description of Group:   In this group, patients learned how to recognize the physical, cognitive, emotional, and behavioral responses they have to anger-provoking situations.  They identified a recent time they became angry and how they reacted.  They analyzed how their reaction was possibly beneficial and how it was possibly unhelpful.  The group discussed a variety of healthier coping skills that could help with such a situation in the future.  Deep breathing was practiced briefly.  Therapeutic Goals: 1. Patients will remember their last incident of anger and how they felt emotionally and physically, what their thoughts were at the time, and how they behaved. 2. Patients will identify how their behavior at that time worked for them, as well as how it worked against them. 3. Patients will explore possible new behaviors to use in future anger situations. 4. Patients will learn that anger itself is normal and cannot be eliminated, and that healthier reactions can assist with resolving conflict rather than worsening situations.  Summary of Patient Progress:  The patient shared that her most recent time of anger was this morniing and said her anger was caused when she wanted a cigarette and wanted to get her son's phone number out of her phone which is in the locker, but she was told no.  She stated this is impeding her plans for discharge.  She also talked about breaking up with her most recent boyfriend because of paranoid thoughts that he was doing wrong things.  She demonstrated growing insight in looking at her own thinking processes and realizing that not all her thoughts are true.  She demonstrated a willingness to grow and change..  Therapeutic Modalities:   Cognitive Behavioral Therapy  Lynnell Chad

## 2017-09-07 DIAGNOSIS — J3489 Other specified disorders of nose and nasal sinuses: Secondary | ICD-10-CM

## 2017-09-07 DIAGNOSIS — K0889 Other specified disorders of teeth and supporting structures: Secondary | ICD-10-CM

## 2017-09-07 DIAGNOSIS — F17210 Nicotine dependence, cigarettes, uncomplicated: Secondary | ICD-10-CM

## 2017-09-07 DIAGNOSIS — F1099 Alcohol use, unspecified with unspecified alcohol-induced disorder: Secondary | ICD-10-CM

## 2017-09-07 DIAGNOSIS — G8929 Other chronic pain: Secondary | ICD-10-CM

## 2017-09-07 DIAGNOSIS — R451 Restlessness and agitation: Secondary | ICD-10-CM

## 2017-09-07 DIAGNOSIS — R450 Nervousness: Secondary | ICD-10-CM

## 2017-09-07 MED ORDER — IBUPROFEN 600 MG PO TABS: 600.0000 mg | ORAL_TABLET | Freq: Four times a day (QID) | ORAL | Status: DC | PRN

## 2017-09-07 MED ORDER — GABAPENTIN 100 MG PO CAPS
100.0000 mg | ORAL_CAPSULE | Freq: Three times a day (TID) | ORAL | Status: DC
  Administered 2017-09-07 – 2017-09-09 (×5): 100 mg via ORAL
  Filled 2017-09-07 (×10): qty 1

## 2017-09-07 MED ORDER — SERTRALINE HCL 100 MG PO TABS
100.0000 mg | ORAL_TABLET | Freq: Every day | ORAL | Status: DC
  Administered 2017-09-08 – 2017-09-10 (×3): 100 mg via ORAL
  Filled 2017-09-07: qty 1
  Filled 2017-09-07: qty 7
  Filled 2017-09-07 (×4): qty 1
  Filled 2017-09-07: qty 7

## 2017-09-07 NOTE — Progress Notes (Addendum)
D. Pt presents with a flat affect and depressed behavior, but brightens during interactions. Per pt's self inventory, pt rates her depression, hopelessness and anxiety a 4/3/4, respectively. Pt observed in dayroom drawing with markers this am and interacting well with peers.Pt writes that her most important goal today is "keeping thoughts from racing" and writes that she will accomplish this goal when she "meditates". Pt currently denies SI/HI and AV hallucinations A. Labs and vitals monitored. Pt compliant with medications. Pt supported emotionally and encouraged to express concerns and ask questions.   R. Pt remains safe with 15 minute checks. Will continue POC.

## 2017-09-07 NOTE — Progress Notes (Signed)
Patient ID: Brandy Holmes, female   DOB: [birth year], 47 y.o.   MRN: [id number] DAR Note: Pt with pained affected observed pacing the hallway; "I tooth hurt really bad." Pt complained of moderate depression and severe LU toothache. Pt denied anxiety, SI/HI or AVH; "I am only depressed." Medications offered as prescribed. All patient's questions and concerns addressed. Support, encouragement, and safe environment provided. Will continue to monitor for any changes. 15-minute safety checks continue. Pt attended wrap-up group. Pt was med compliant.

## 2017-09-07 NOTE — Plan of Care (Signed)
  Problem: Education: Goal: Emotional status will improve Outcome: Progressing   Problem: Education: Goal: Mental status will improve Outcome: Progressing   Problem: Education: Goal: Verbalization of understanding the information provided will improve Outcome: Progressing   

## 2017-09-07 NOTE — Progress Notes (Addendum)
[facility] MD Progress Note  09/07/2017 1:58 PM Brandy Holmes  MRN:  [id number]   Subjective: Patient reports today that she is doing well.  She still feels depressed, but she states that that is because she does not have her cigarettes or her music on her phone.  She refuses to use a nicotine patch or the nicotine gum.  She continues to talk about her previous medications and what has not has not worked.  Patient mentioned being on Abilify, Effexor, and gabapentin in the past.  She also reports that she smokes marijuana frequently and that the reason she smokes the marijuana is to keep her calm because she gets agitated quickly.  She denies any SI/HI/AVH and contracts for safety.  She is concerned about getting out soon and wants to ensure that any new medications do not keep her here longer.  Objective: Patient's chart and findings reviewed and discussed with treatment team.  Patient presents in her room with the door closed lying in her bed awake.  Patient is pleasant and cooperative and has a flat affect.  After discussing medications and consulting with Dr. Jama Flavors will increase Zoloft to 100 mg daily starting tomorrow and will start gabapentin 100 mg 3 times daily.  Zoloft increased and will be used to help improve her depression.  The start of the gabapentin will help with her agitation as well as assist with her reported chronic pain.  Due to documented report of patient's tooth pain and pressure will increase ibuprofen to 600 mg every 6 hours as needed  Principal Problem: MDD (major depressive disorder), recurrent episode, severe (HCC) Diagnosis:   Patient Active Problem List   Diagnosis Date Noted  . MDD (major depressive disorder), recurrent episode, severe (HCC) [F33.2] 09/05/2017   Total Time spent with patient: 30 minutes  Past Psychiatric History: See H&P  Past Medical History:  Past Medical History:  Diagnosis Date  . Arthritis   . Hypertension     Past Surgical History:  Procedure  Laterality Date  . ABDOMINAL HYSTERECTOMY    . BREAST SURGERY    . CHOLECYSTECTOMY     Family History: History reviewed. No pertinent family history. Family Psychiatric  History: See H&P Social History:  Social History   Substance and Sexual Activity  Alcohol Use Yes  . Alcohol/week: 1.8 - 2.4 oz  . Types: 3 - 4 Shots of liquor per week   Comment: ocassionally per Pt      Social History   Substance and Sexual Activity  Drug Use Yes  . Types: Marijuana    Social History   Socioeconomic History  . Marital status: Single    Spouse name: Not on file  . Number of children: Not on file  . Years of education: Not on file  . Highest education level: Not on file  Occupational History  . Not on file  Social Needs  . Financial resource strain: Not on file  . Food insecurity:    Worry: Not on file    Inability: Not on file  . Transportation needs:    Medical: Not on file    Non-medical: Not on file  Tobacco Use  . Smoking status: Current Every Day Smoker    Packs/day: 0.50  . Smokeless tobacco: Never Used  Substance and Sexual Activity  . Alcohol use: Yes    Alcohol/week: 1.8 - 2.4 oz    Types: 3 - 4 Shots of liquor per week    Comment: ocassionally per Pt   .  Drug use: Yes    Types: Marijuana  . Sexual activity: Yes    Birth control/protection: None  Lifestyle  . Physical activity:    Days per week: Not on file    Minutes per session: Not on file  . Stress: Not on file  Relationships  . Social connections:    Talks on phone: Not on file    Gets together: Not on file    Attends religious service: Not on file    Active member of club or organization: Not on file    Attends meetings of clubs or organizations: Not on file    Relationship status: Not on file  Other Topics Concern  . Not on file  Social History Narrative  . Not on file   Additional Social History:                         Sleep: Good  Appetite:  Good  Current Medications: Current  Facility-Administered Medications  Medication Dose Route Frequency Provider Last Rate Last Dose  . acetaminophen (TYLENOL) tablet 650 mg  650 mg Oral Q6H PRN Truman Hayward, FNP      . alum & mag hydroxide-simeth (MAALOX/MYLANTA) 200-200-20 MG/5ML suspension 30 mL  30 mL Oral Q4H PRN Starkes, Takia S, FNP      . benzocaine (ORAJEL) 10 % mucosal gel   Mouth/Throat QID PRN Blaize Nipper, Rockey Situ, MD      . gabapentin (NEURONTIN) capsule 100 mg  100 mg Oral TID Money, Gerlene Burdock, FNP      . hydrOXYzine (ATARAX/VISTARIL) tablet 25 mg  25 mg Oral TID PRN Truman Hayward, FNP      . ibuprofen (ADVIL,MOTRIN) tablet 400 mg  400 mg Oral Q6H PRN Allegra Cerniglia, Rockey Situ, MD   400 mg at 09/06/17 2035  . magnesium hydroxide (MILK OF MAGNESIA) suspension 30 mL  30 mL Oral Daily PRN Truman Hayward, FNP      . [START ON 09/08/2017] sertraline (ZOLOFT) tablet 100 mg  100 mg Oral Daily Money, Feliz Beam B, FNP      . traZODone (DESYREL) tablet 100 mg  100 mg Oral QHS PRN Truman Hayward, FNP        Lab Results: No results found for this or any previous visit (from the past 48 hour(s)).  Blood Alcohol level:  Lab Results  Component Value Date   ETH <10 09/04/2017    Metabolic Disorder Labs: Lab Results  Component Value Date   HGBA1C 5.0 09/05/2017   MPG 96.8 09/05/2017   No results found for: PROLACTIN Lab Results  Component Value Date   CHOL 203 (H) 09/05/2017   TRIG 48 09/05/2017   HDL 45 09/05/2017   CHOLHDL 4.5 09/05/2017   VLDL 10 09/05/2017   LDLCALC 148 (H) 09/05/2017    Physical Findings: AIMS: Facial and Oral Movements Muscles of Facial Expression: None, normal Lips and Perioral Area: None, normal Jaw: None, normal Tongue: None, normal,Extremity Movements Upper (arms, wrists, hands, fingers): None, normal Lower (legs, knees, ankles, toes): None, normal, Trunk Movements Neck, shoulders, hips: None, normal, Overall Severity Severity of abnormal movements (highest score from questions above):  None, normal Incapacitation due to abnormal movements: None, normal Patient's awareness of abnormal movements (rate only patient's report): No Awareness, Dental Status Current problems with teeth and/or dentures?: No Does patient usually wear dentures?: No  CIWA:  CIWA-Ar Total: 0 COWS:  COWS Total Score: 0  Musculoskeletal: Strength & Muscle  Tone: within normal limits Gait & Station: normal Patient leans: N/A  Psychiatric Specialty Exam: Physical Exam  Nursing note and vitals reviewed. Constitutional: She is oriented to person, place, and time. She appears well-developed and well-nourished.  Cardiovascular: Normal rate.  Respiratory: Effort normal.  Musculoskeletal: Normal range of motion.  Neurological: She is alert and oriented to person, place, and time.  Skin: Skin is warm.    Review of Systems  Constitutional: Negative.   HENT: Positive for sinus pain (from toothache).   Eyes: Negative.   Respiratory: Negative.   Cardiovascular: Negative.   Gastrointestinal: Negative.   Genitourinary: Negative.   Musculoskeletal: Negative.   Skin: Negative.   Neurological: Negative.   Endo/Heme/Allergies: Negative.   Psychiatric/Behavioral: Positive for depression and substance abuse. Negative for hallucinations and suicidal ideas. The patient is nervous/anxious and has insomnia.     Blood pressure (!) 183/86, pulse (!) 54, temperature 98.2 F (36.8 C), resp. rate 16, height  (1.651 m), weight 98.9 kg (218 lb), last menstrual period 10/11/2015.Body mass index is 36.28 kg/m.  General Appearance: Casual  Eye Contact:  Good  Speech:  Clear and Coherent and Normal Rate  Volume:  Normal  Mood:  Depressed  Affect:  Flat  Thought Process:  Goal Directed and Descriptions of Associations: Intact  Orientation:  Full (Time, Place, and Person)  Thought Content:  WDL  Suicidal Thoughts:  No  Homicidal Thoughts:  No  Memory:  Immediate;   Good Recent;   Good Remote;   Good   Judgement:  Fair  Insight:  Fair  Psychomotor Activity:  Normal  Concentration:  Concentration: Good and Attention Span: Good  Recall:  Good  Fund of Knowledge:  Good  Language:  Good  Akathisia:  No  Handed:  Right  AIMS (if indicated):     Assets:  Communication Skills Desire for Improvement Financial Resources/Insurance Housing Physical Health Social Support Transportation  ADL's:  Intact  Cognition:  WNL  Sleep:  Number of Hours: 5.75   Problems addressed MDD severe recurrent  Treatment Plan Summary: Daily contact with patient to assess and evaluate symptoms and progress in treatment, Medication management and Plan is to: -Increase Zoloft 100 mg p.o. daily for mood stability - Continue trazodone 100 mg p.o. nightly as needed for insomnia -Continue Vistaril 25 mg p.o. 3 times daily as needed for anxiety -Start Neurontin 100 mg p.o. 3 times daily for agitation and pain -Increase ibuprofen to 600 mg every 6 hours as needed for pain -Encourage group therapy participation  Maryfrances Bunnell, FNP 09/07/2017, 1:58 PM    ..Agree with NP Progress Note

## 2017-09-07 NOTE — [facility] Group Notes (Signed)
[facility] LCSW Group Therapy Note  09/07/2017  10:00-11:00AM  Type of Therapy and Topic:  Group Therapy:  Acknowledging and Resolving Issues with Mothers  Participation Level:  Active   Description of Group:   Patients in this group were asked to briefly describe their experience with the mother figure(s) in their lives, both in childhood and adulthood.  Different types of support provided by these individuals were identified.   Patients were then encouraged to determine whether their mother figure was or is a healthy or unhealthy support.  The manner in which that early relationship has shaped patient's feelings and life decisions was pointed out and acknowledged.  Group members gave support to each other.  CSW led a discussion on how helpful it can be to resolve past issues, and how this can be done whether the mother figure is now alive or already deceased.  An emphasis was placed on continuing to work with a therapist on these issues  when patients leave the hospital in order to be able to focus on the future instead of the past, to continue becoming healthier and happier.   Therapeutic Goals: 1)  discuss the possibility of mother figure(s) being positive and/or negative in one's life, normalizing that some people never had positive experiences with "maternal" persons  2)  describe patient's specific example of mother figure(s), allowing time to vent  3)  identify the patient's current need for resolution in the relationship with the aforementioned person  4)  elicit commitments to work on resolving feelings about mother figure(s) in order to move forward in life and wellness   Summary of Patient Progress:  The patient expressed full comprehension of the concepts presented, and said tearfully that throughout her childhood, her mother treated her like Cinderella, only meant to do the chores and not to be nurtured.  She was particularly upset when talking about how much she tried to be a nurturing mother  herself because of the lack of this in her own childhood, and yet her children are angry at her and distant.  She received positive encouragement from other group members.  She was given strokes for confronting her mother assertively but not aggressively about acting as though she loves her sister more.  She was encouraged to keep working on resolution steps.   Therapeutic Modalities:   Processing Brief Solution-Focused Therapy  Lynnell Chad

## 2017-09-08 DIAGNOSIS — R110 Nausea: Secondary | ICD-10-CM

## 2017-09-08 MED ORDER — CLONIDINE HCL 0.1 MG PO TABS: 0.1000 mg | ORAL_TABLET | Freq: Three times a day (TID) | ORAL | Status: DC | PRN

## 2017-09-08 MED ORDER — LISINOPRIL 5 MG PO TABS
ORAL_TABLET | ORAL | Status: AC
  Filled 2017-09-08: qty 2

## 2017-09-08 MED ORDER — LISINOPRIL 10 MG PO TABS
10.0000 mg | ORAL_TABLET | Freq: Every day | ORAL | Status: DC
  Administered 2017-09-08 – 2017-09-10 (×3): 10 mg via ORAL
  Filled 2017-09-08 (×3): qty 1

## 2017-09-08 NOTE — [facility] Group Notes (Signed)
Adult Psychoeducational Group Note  Date:  09/08/2017 Time:  9:09 PM  Group Topic/Focus:  Wrap-Up Group:   The focus of this group is to help patients review their daily goal of treatment and discuss progress on daily workbooks.  Participation Level:  Active  Participation Quality:  Appropriate and Attentive  Affect:  Appropriate  Cognitive:  Alert and Appropriate  Insight: Appropriate and Good  Engagement in Group:  Engaged  Modes of Intervention:  Discussion and Education  Additional Comments:  Pt attended and participated in wrap up group this evening. Pt had a good day even though pt slept all day sue to the medications they are on. Pt completed their goal to stay positive today.    Chrisandra Netters 09/08/2017, 9:09 PM

## 2017-09-08 NOTE — Plan of Care (Signed)
  Problem: Safety: Goal: Periods of time without injury will increase Outcome: Progressing   Problem: Medication: Goal: Compliance with prescribed medication regimen will improve Outcome: Progressing  DAR NOTE: Patient presents with irritable  affect and depressed mood.  Denies pain, auditory and visual hallucinations.  Described energy level as normal and concentration as good.  Rates depression at 0, hopelessness at 0, and anxiety at 1.  Maintained on routine safety checks.  Medications given as prescribed.  Support and encouragement offered as needed.  Attended group and participated.  States goal for today is "being released."  Elevated BP reading reported to MD.  Patient started on Lisinopril 10 mg daily and Clonidine PRN.  Patient visible in milieu with minimal interaction with staff and peers.  Patient remained safe on the unit.

## 2017-09-08 NOTE — Progress Notes (Signed)
Patient ID: Brandy Holmes, female   DOB: May 22, 1970, 47 y.o.   MRN: [id number] DAR Note: Pt observed in dayroom interacting with peers. Pt at the time of assessment endorsed moderate depression. Pt denied pain anxiety, SI/HI or AVH. Medications offered as prescribed. All patient's questions and concerns addressed. Support, encouragement, and safe environment provided. Will continue to monitor for any changes. 15-minute safety checks continue. Pt attended wrap-up group. Pt had no schedule medications.

## 2017-09-08 NOTE — [facility] Group Notes (Signed)
LCSW Group Therapy Note 09/08/2017 1:09 PM  Type of Therapy and Topic: Group Therapy: Overcoming Obstacles  Participation Level: Active  Description of Group:  In this group patients will be encouraged to explore what they see as obstacles to their own wellness and recovery. They will be guided to discuss their thoughts, feelings, and behaviors related to these obstacles. The group will process together ways to cope with barriers, with attention given to specific choices patients can make. Each patient will be challenged to identify changes they are motivated to make in order to overcome their obstacles. This group will be process-oriented, with patients participating in exploration of their own experiences as well as giving and receiving support and challenge from other group members.  Therapeutic Goals: 1. Patient will identify personal and current obstacles as they relate to admission. 2. Patient will identify barriers that currently interfere with their wellness or overcoming obstacles.  3. Patient will identify feelings, thought process and behaviors related to these barriers. 4. Patient will identify two changes they are willing to make to overcome these obstacles:   Summary of Patient Progress  Bryana was engaged and participated throughout the group session. Mettie reports that her main obstacle is "myself". Marelin reports that her negative thinking gets in the way of her accomplishing any goal she sets for herself. Dallys reports that if she could gt her own place, she believes that she would be better off and that it would be beneficial for her recovery.    Therapeutic Modalities:  Cognitive Behavioral Therapy Solution Focused Therapy Motivational Interviewing Relapse Prevention Therapy   Alcario Drought Clinical Social Worker

## 2017-09-08 NOTE — Progress Notes (Signed)
[facility] MD Progress Note  09/08/2017 10:00 AM Brandy Holmes  MRN:  [id number] Subjective: Patient is seen and examined.  Patient is a 47 year old female with past psychiatric history significant for major depression.  She is seen in follow-up.  She was seen yesterday, and was cooperative but had a flat affect.  After discussing medications and consulting with Dr. Jama Flavors it was decided to increase her Zoloft 200 mg p.o. daily and start gabapentin 100 mg p.o. 3 times daily.  She stated she was mildly nauseated this morning, but overall doing better.  She stated she had discussed with some of her family members things that were on her chest, and she felt a certain degree of "release for this".  Outside of the mild nausea she denied any other side effects to her current medications.  She denied any suicidal ideation. Principal Problem: MDD (major depressive disorder), recurrent episode, severe (HCC) Diagnosis:   Patient Active Problem List   Diagnosis Date Noted  . MDD (major depressive disorder), recurrent episode, severe (HCC) [F33.2] 09/05/2017   Total Time spent with patient: 20 minutes  Past Psychiatric History: See admission H&P  Past Medical History:  Past Medical History:  Diagnosis Date  . Arthritis   . Hypertension     Past Surgical History:  Procedure Laterality Date  . ABDOMINAL HYSTERECTOMY    . BREAST SURGERY    . CHOLECYSTECTOMY     Family History: History reviewed. No pertinent family history. Family Psychiatric  History: See admission H&P Social History:  Social History   Substance and Sexual Activity  Alcohol Use Yes  . Alcohol/week: 1.8 - 2.4 oz  . Types: 3 - 4 Shots of liquor per week   Comment: ocassionally per Pt      Social History   Substance and Sexual Activity  Drug Use Yes  . Types: Marijuana    Social History   Socioeconomic History  . Marital status: Single    Spouse name: Not on file  . Number of children: Not on file  . Years of education: Not on  file  . Highest education level: Not on file  Occupational History  . Not on file  Social Needs  . Financial resource strain: Not on file  . Food insecurity:    Worry: Not on file    Inability: Not on file  . Transportation needs:    Medical: Not on file    Non-medical: Not on file  Tobacco Use  . Smoking status: Current Every Day Smoker    Packs/day: 0.50  . Smokeless tobacco: Never Used  Substance and Sexual Activity  . Alcohol use: Yes    Alcohol/week: 1.8 - 2.4 oz    Types: 3 - 4 Shots of liquor per week    Comment: ocassionally per Pt   . Drug use: Yes    Types: Marijuana  . Sexual activity: Yes    Birth control/protection: None  Lifestyle  . Physical activity:    Days per week: Not on file    Minutes per session: Not on file  . Stress: Not on file  Relationships  . Social connections:    Talks on phone: Not on file    Gets together: Not on file    Attends religious service: Not on file    Active member of club or organization: Not on file    Attends meetings of clubs or organizations: Not on file    Relationship status: Not on file  Other Topics Concern  .  Not on file  Social History Narrative  . Not on file   Additional Social History:                         Sleep: Fair  Appetite:  Good  Current Medications: Current Facility-Administered Medications  Medication Dose Route Frequency Provider Last Rate Last Dose  . acetaminophen (TYLENOL) tablet 650 mg  650 mg Oral Q6H PRN Truman Hayward, FNP      . alum & mag hydroxide-simeth (MAALOX/MYLANTA) 200-200-20 MG/5ML suspension 30 mL  30 mL Oral Q4H PRN Starkes, Takia S, FNP      . benzocaine (ORAJEL) 10 % mucosal gel   Mouth/Throat QID PRN Cobos, Rockey Situ, MD      . gabapentin (NEURONTIN) capsule 100 mg  100 mg Oral TID Money, Gerlene Burdock, FNP   100 mg at 09/08/17 0815  . hydrOXYzine (ATARAX/VISTARIL) tablet 25 mg  25 mg Oral TID PRN Truman Hayward, FNP      . ibuprofen (ADVIL,MOTRIN) tablet 600  mg  600 mg Oral Q6H PRN Money, Feliz Beam B, FNP      . magnesium hydroxide (MILK OF MAGNESIA) suspension 30 mL  30 mL Oral Daily PRN Truman Hayward, FNP      . sertraline (ZOLOFT) tablet 100 mg  100 mg Oral Daily Money, Gerlene Burdock, FNP   100 mg at 09/08/17 0815  . traZODone (DESYREL) tablet 100 mg  100 mg Oral QHS PRN Truman Hayward, FNP        Lab Results: No results found for this or any previous visit (from the past 48 hour(s)).  Blood Alcohol level:  Lab Results  Component Value Date   ETH <10 09/04/2017    Metabolic Disorder Labs: Lab Results  Component Value Date   HGBA1C 5.0 09/05/2017   MPG 96.8 09/05/2017   No results found for: PROLACTIN Lab Results  Component Value Date   CHOL 203 (H) 09/05/2017   TRIG 48 09/05/2017   HDL 45 09/05/2017   CHOLHDL 4.5 09/05/2017   VLDL 10 09/05/2017   LDLCALC 148 (H) 09/05/2017    Physical Findings: AIMS: Facial and Oral Movements Muscles of Facial Expression: None, normal Lips and Perioral Area: None, normal Jaw: None, normal Tongue: None, normal,Extremity Movements Upper (arms, wrists, hands, fingers): None, normal Lower (legs, knees, ankles, toes): None, normal, Trunk Movements Neck, shoulders, hips: None, normal, Overall Severity Severity of abnormal movements (highest score from questions above): None, normal Incapacitation due to abnormal movements: None, normal Patient's awareness of abnormal movements (rate only patient's report): No Awareness, Dental Status Current problems with teeth and/or dentures?: No Does patient usually wear dentures?: No  CIWA:  CIWA-Ar Total: 0 COWS:  COWS Total Score: 0  Musculoskeletal: Strength & Muscle Tone: within normal limits Gait & Station: normal Patient leans: N/A  Psychiatric Specialty Exam: Physical Exam  Nursing note and vitals reviewed. Constitutional: She is oriented to person, place, and time. She appears well-developed and well-nourished.  HENT:  Head: Normocephalic  and atraumatic.  Respiratory: Effort normal.  Musculoskeletal: Normal range of motion.  Neurological: She is alert and oriented to person, place, and time.    ROS  Blood pressure (!) 162/95, pulse 72, temperature 98.2 F (36.8 C), temperature source Oral, resp. rate 16, height  (1.651 m), weight 98.9 kg (218 lb), last menstrual period 10/11/2015.Body mass index is 36.28 kg/m.  General Appearance: Casual  Eye Contact:  Fair  Speech:  Normal Rate  Volume:  Normal  Mood:  Anxious  Affect:  Appropriate  Thought Process:  Coherent  Orientation:  Full (Time, Place, and Person)  Thought Content:  Logical  Suicidal Thoughts:  No  Homicidal Thoughts:  No  Memory:  Immediate;   Fair  Judgement:  Intact  Insight:  Fair  Psychomotor Activity:  Normal  Concentration:  Concentration: Fair  Recall:  [employer] of Knowledge:  Fair  Language:  Fair  Akathisia:  Negative  Handed:  Right  AIMS (if indicated):     Assets:  Communication Skills Desire for Improvement Physical Health Resilience  ADL's:  Intact  Cognition:  WNL  Sleep:  Number of Hours: 5.75     Treatment Plan Summary: Daily contact with patient to assess and evaluate symptoms and progress in treatment, Medication management and Plan Patient is seen and examined.  Patient is a 47 year old female with the above-stated past psychiatric history seen in follow-up.  Her mood appears to be improving.  Her Zoloft was increased this morning.  She was started on gabapentin 100 mg p.o. 3 times daily for anxiety.  Hopefully she will continue to improve.  We decided not to change any of her medications, she has good day today probable discharge tomorrow.  Otherwise she will continue the ibuprofen for her tooth pain.  Antonieta Pert, MD 09/08/2017, 10:00 AM

## 2017-09-08 NOTE — [facility] Suicide Risk Assessment (Signed)
[facility] INPATIENT:  Family/Significant Other Suicide Prevention Education  Suicide Prevention Education:  Contact Attempts: Conception Doebler, father [phone]) has been identified by the patient as the family member/significant other with whom the patient will be residing, and identified as the person(s) who will aid the patient in the event of a mental health crisis.  With written consent from the patient, two attempts were made to provide suicide prevention education, prior to and/or following the patient's discharge.  We were unsuccessful in providing suicide prevention education.  A suicide education pamphlet was given to the patient to share with family/significant other.  Date and time of first attempt:09/08/2017 / 2:47pm   Maeola Sarah 09/08/2017, 2:47 PM

## 2017-09-08 NOTE — [facility] Group Notes (Signed)
[facility] Group Notes:  (Nursing/MHT/Case Management/Adjunct)  Date:  09/07/17  Time:  8:20p  Type of Therapy:  Wrap Up Group  Participation Level:  Active  Participation Quality:  Appropriate, Attentive, Sharing and Supportive  Affect:  Appropriate  Cognitive:  Alert and Appropriate  Insight:  Good  Engagement in Group:  Defensive, Engaged and Supportive  Modes of Intervention:  Discussion and Support  Summary of Progress/Problems:  Brandy Holmes reported that her day was an 8/10 (10 the best)  She reported that she achieved her goal of trying to take some weight off of her shoulders.  "I let things go and maintained."  Another peer was angry at the group because she believes that everyone was talking about her.  Brandy Holmes was able to clarify to her peer that she did speak with the RN about the issues and not to blame the others in the room.   She apologized for her statement that it was not her intention to hurt her feelings.    Norm Parcel Rashida Ladouceur 09/08/2017, 12:26 AM

## 2017-09-08 NOTE — Progress Notes (Signed)
Adult Psychoeducational Group Note  Date:  09/08/2017 Time:  2:16 PM  Group Topic/Focus:  Dimensions of Wellness:   The focus of this group is to introduce the topic of wellness and discuss the role each dimension of wellness plays in total health.  Participation Level:  Minimal  Participation Quality:  Appropriate  Affect:  Appropriate  Cognitive:  Alert  Insight: Appropriate  Engagement in Group:  Engaged  Modes of Intervention:  Discussion and Education  Additional Comments:  Pt only concern doing group this morning was that of  her elevated B/P  Brandy Holmes E 09/08/2017, 2:16 PM

## 2017-09-09 DIAGNOSIS — I10 Essential (primary) hypertension: Secondary | ICD-10-CM

## 2017-09-09 MED ORDER — GABAPENTIN 100 MG PO CAPS
200.0000 mg | ORAL_CAPSULE | Freq: Three times a day (TID) | ORAL | Status: DC
  Administered 2017-09-09 – 2017-09-10 (×4): 200 mg via ORAL
  Filled 2017-09-09 (×3): qty 2
  Filled 2017-09-09: qty 42
  Filled 2017-09-09: qty 2
  Filled 2017-09-09 (×3): qty 42
  Filled 2017-09-09 (×2): qty 2
  Filled 2017-09-09 (×2): qty 42

## 2017-09-09 NOTE — Progress Notes (Signed)
[facility] MD Progress Note  09/09/2017 10:40 AM Brandy Holmes  MRN:  [id number] Subjective: Patient reports she has improved compared to how she felt that admission.  She does remain anxious and in particular regarding disposition planning (patient currently homeless and states she was living in a vehicle before admission) Currently denies medication side effects.  Does not endorse suicidal ideations. Objective: I have met with patient and I have discussed case with treatment team. Patient is a 47 year old female who has a history of depression and PTSD.  She presented to the emergency room due to worsening depression and suicidal ideations.  Homelessness has been a major stressor. As above, endorses partial improvement since admission.  Currently denies suicidal ideations and does present with a fuller range of affect, smiles at times appropriately during our session. She is focused on disposition planning, has met with the social worker to discuss options.  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 At this time is on Zoloft, now at 100 mg a day, thus far tolerating well.  Visible on unit, cooperative on approach, no disruptive or agitated behaviors.   Principal Problem: MDD (major depressive disorder), recurrent episode, severe ([place]) Diagnosis:   Patient Active Problem List   Diagnosis Date Noted  . MDD (major depressive disorder), recurrent episode, severe ([place]) [F33.2] 09/05/2017   Total Time spent with patient: 20 minutes  Past Psychiatric History: See admission H&P  Past Medical History:  Past Medical History:  Diagnosis Date  . Arthritis   . Hypertension     Past Surgical History:  Procedure Laterality Date  . ABDOMINAL HYSTERECTOMY    . BREAST SURGERY    . CHOLECYSTECTOMY     Family History: History reviewed. No pertinent family history. Family Psychiatric  History: See admission H&P Social History:  Social History    Substance and Sexual Activity  Alcohol Use Yes  . Alcohol/week: 1.8 - 2.4 oz  . Types: 3 - 4 Shots of liquor per week   Comment: ocassionally per Pt      Social History   Substance and Sexual Activity  Drug Use Yes  . Types: Marijuana    Social History   Socioeconomic History  . Marital status: Single    Spouse name: Not on file  . Number of children: Not on file  . Years of education: Not on file  . Highest education level: Not on file  Occupational History  . Not on file  Social Needs  . Financial resource strain: Not on file  . Food insecurity:    Worry: Not on file    Inability: Not on file  . Transportation needs:    Medical: Not on file    Non-medical: Not on file  Tobacco Use  . Smoking status: Current Every Day Smoker    Packs/day: 0.50  . Smokeless tobacco: Never Used  Substance and Sexual Activity  . Alcohol use: Yes    Alcohol/week: 1.8 - 2.4 oz    Types: 3 - 4 Shots of liquor per week    Comment: ocassionally per Pt   . Drug use: Yes    Types: Marijuana  . Sexual activity: Yes    Birth control/protection: None  Lifestyle  . Physical activity:    Days per week: Not on file    Minutes per session: Not on file  . Stress: Not on file  Relationships  . Social connections:    Talks on phone: Not on file  Gets together: Not on file    Attends religious service: Not on file    Active member of club or organization: Not on file    Attends meetings of clubs or organizations: Not on file    Relationship status: Not on file  Other Topics Concern  . Not on file  Social History Narrative  . Not on file   Additional Social History:   Sleep: Improving  Appetite:  Improving  Current Medications: Current Facility-Administered Medications  Medication Dose Route Frequency Provider Last Rate Last Dose  . acetaminophen (TYLENOL) tablet 650 mg  650 mg Oral Q6H PRN Nanci Pina, FNP      . alum & mag hydroxide-simeth (MAALOX/MYLANTA) 200-200-20  MG/5ML suspension 30 mL  30 mL Oral Q4H PRN Starkes, Takia S, FNP      . benzocaine (ORAJEL) 10 % mucosal gel   Mouth/Throat QID PRN Cobos, Fernando A, MD      . cloNIDine (CATAPRES) tablet 0.1 mg  0.1 mg Oral Q8H PRN Sharma Covert, MD      . gabapentin (NEURONTIN) capsule 100 mg  100 mg Oral TID Money, Darnelle Maffucci B, FNP   100 mg at 09/09/17 4431  . hydrOXYzine (ATARAX/VISTARIL) tablet 25 mg  25 mg Oral TID PRN Nanci Pina, FNP      . ibuprofen (ADVIL,MOTRIN) tablet 600 mg  600 mg Oral Q6H PRN Money, Lowry Ram, FNP      . lisinopril (PRINIVIL,ZESTRIL) tablet 10 mg  10 mg Oral Daily Sharma Covert, MD   10 mg at 09/09/17 0806  . magnesium hydroxide (MILK OF MAGNESIA) suspension 30 mL  30 mL Oral Daily PRN Nanci Pina, FNP      . sertraline (ZOLOFT) tablet 100 mg  100 mg Oral Daily Money, Lowry Ram, FNP   100 mg at 09/09/17 0806  . traZODone (DESYREL) tablet 100 mg  100 mg Oral QHS PRN Nanci Pina, FNP        Lab Results: No results found for this or any previous visit (from the past 88 hour(s)).  Blood Alcohol level:  Lab Results  Component Value Date   ETH <10 54/00/8676    Metabolic Disorder Labs: Lab Results  Component Value Date   HGBA1C 5.0 09/05/2017   MPG 96.8 09/05/2017   No results found for: PROLACTIN Lab Results  Component Value Date   CHOL 203 (H) 09/05/2017   TRIG 48 09/05/2017   HDL 45 09/05/2017   CHOLHDL 4.5 09/05/2017   VLDL 10 09/05/2017   LDLCALC 148 (H) 09/05/2017    Physical Findings: AIMS: Facial and Oral Movements Muscles of Facial Expression: None, normal Lips and Perioral Area: None, normal Jaw: None, normal Tongue: None, normal,Extremity Movements Upper (arms, wrists, hands, fingers): None, normal Lower (legs, knees, ankles, toes): None, normal, Trunk Movements Neck, shoulders, hips: None, normal, Overall Severity Severity of abnormal movements (highest score from questions above): None, normal Incapacitation due to abnormal  movements: None, normal Patient's awareness of abnormal movements (rate only patient's report): No Awareness, Dental Status Current problems with teeth and/or dentures?: No Does patient usually wear dentures?: No  CIWA:  CIWA-Ar Total: 0 COWS:  COWS Total Score: 0  Musculoskeletal: Strength & Muscle Tone: within normal limits Gait & Station: normal Patient leans: N/A  Psychiatric Specialty Exam: Physical Exam  Nursing note and vitals reviewed. Constitutional: She is oriented to person, place, and time. She appears well-developed and well-nourished.  HENT:  Head: Normocephalic and atraumatic.  Respiratory: Effort normal.  Musculoskeletal: Normal range of motion.  Neurological: She is alert and oriented to person, place, and time.    ROS at this time denies headache, no chest pain, no shortness of breath, no vomiting  Blood pressure (!) 167/94, pulse 68, temperature 98.4 F (36.9 C), temperature source Oral, resp. rate 16, height 5' 5"  (1.651 m), weight 98.9 kg (218 lb), last menstrual period 10/11/2015.Body mass index is 36.28 kg/m.  General Appearance: Casual  Eye Contact:  Improved eye contact  Speech:  Normal Rate  Volume:  Normal  Mood:  Mood has improved partially, less depressed  Affect:  Remains vaguely anxious but affect is more reactive, smiles at times appropriately  Thought Process:  Linear and Descriptions of Associations: Intact  Orientation:  Full (Time, Place, and Person)  Thought Content:  No hallucinations, no delusions expressed, does not appear internally preoccupied, ruminative about stressors  Suicidal Thoughts:  No-today denies suicidal ideations, no self-injurious ideations, able to contract for safety on the unit at this time  Homicidal Thoughts:  No  Memory:  Recent and remote grossly intact  Judgement:  Other:  Improving  Insight:  Fair and Improving  Psychomotor Activity:  Normal  Concentration:  Concentration: Good and Attention Span: Good  Recall:   Good  Fund of Knowledge:  Good  Language:  Good  Akathisia:  Negative  Handed:  Right  AIMS (if indicated):     Assets:  Communication Skills Desire for Improvement Physical Health Resilience  ADL's:  Intact  Cognition:  WNL  Sleep:  Number of Hours: 5.75    Assessment -patient presents with partial but significant improvement compared to her admission presentation.  She presents less depressed with a more reactive affect. She does remain anxious which is currently at least partially due to homelessness.  She is future oriented, and today is expressing interest in possible disposition plans such as going to [employer].  Currently tolerating medications well, on Zoloft.   Treatment Plan Summary: Treatment plan reviewed as below today May 14 Encourage group and milieu participation to work on Radiographer, therapeutic and symptom reduction Treatment team working on disposition planning, see above Increase Neurontin to 200 mg 3 times a day to address anxiety Continue Zoloft 200 mg a day for depression, anxiety Continue Trazodone 100 mg nightly PRN for insomnia as needed Continue Vistaril 25 mg every 8 hours as needed for anxiety as needed Continue lisinopril and clonidine PRN for management of HTN   Jenne Campus, MD 09/09/2017, 10:39 AM   Patient ID: Danie Binder, female   DOB: 1970/09/17, 47 y.o.   MRN: [id number]

## 2017-09-09 NOTE — Progress Notes (Signed)
Recreation Therapy Notes  Animal-Assisted Activity (AAA) Program Checklist/Progress Notes Patient Eligibility Criteria Checklist & Daily Group note for Rec Tx Intervention  Date: 5.14.19 Time: 1430 Location: [address]   AAA/T Program Assumption of Risk Form signed by Engineer, production or Parent Legal Guardian YES   Patient is free of allergies or sever asthma NO  Patient reports no fear of animals YES   Patient reports no history of cruelty to animals YES   Patient understands his/her participation is voluntary YES   Patient washes hands before animal contact YES   Patient washes hands after animal contact YES   Behavioral Response: Engaged  Education: Charity fundraiser, Appropriate Animal Interaction   Education Outcome: Acknowledges understanding/In group clarification offered/Needs additional education.   Clinical Observations/Feedback: Pt attended and participated in activity.    Caroll Rancher, LRT/CTRS         Caroll Rancher A 09/09/2017 3:43 PM

## 2017-09-09 NOTE — [facility] Group Notes (Signed)
[facility] Group Therapy      09/09/2017 2:07 PM  Type of Therapy: Mental Health Association Presentation  Participation Level: Active  Participation Quality: Attentive  Affect: Appropriate  Cognitive: Oriented  Insight: Developing/Improving  Engagement in Therapy: Engaged  Modes of Intervention: Discussion, Education and Socialization  Summary of Progress/Problems: Mental Health Association (MHA) Speaker came to talk about his personal journey with mental health. The pt processed ways by which to relate to the speaker. MHA speaker provided handouts and educational information pertaining to groups and services offered by the [facility]. Pt was engaged in speaker's presentation and was receptive to resources provided.    Alcario Drought Clinical Social Worker

## 2017-09-09 NOTE — Progress Notes (Signed)
Patient ID: Brandy Holmes, female   DOB: 1970/06/17, 47 y.o.   MRN: [id number] Pt notified during shift that her father passed.  Support provided.  Pt receptive. Pt verbalizing desire to d/c d/t father's death.  Pt stated she will be the one responsible for all arrangements.  Pt denied SI, HI, AVH.  Fifteen minute checks continue for patient safety.  Pt safe on unit.

## 2017-09-09 NOTE — Progress Notes (Signed)
Patient ID: Brandy Holmes, female   DOB: May 26, 1970, 47 y.o.   MRN: [id number]  Pt currently presents with a blunted affect and depressed, cooperative behavior. Pt forwards little to writer, seen in the dayroom but has limited interaction with peers. Pt endorses intent to leave [facility] tomorrow to go back to the state where her family is from with her sister for fathers funeral. Reports she intends to stay and live there and that she has a good support system in her aunt there. Pt has concerns that she will most likely need prescription samples before finding a PCP and paying for her prescriptions there. Pt reports good sleep without any sleep aids.  Pt provided with medications per providers orders. Pt's labs and vitals were monitored throughout the night. Pt given a 1:1 about emotional and mental status. Pt supported and encouraged to express concerns and questions. Pt educated on medications and grief counseling and support, needs reinforcement.   Pt's safety ensured with 15 minute and environmental checks. Pt currently denies SI/HI and A/V hallucinations. Pt verbally agrees to seek staff if SI/HI or A/VH occurs and to consult with staff before acting on any harmful thoughts. Will continue POC.

## 2017-09-09 NOTE — Progress Notes (Signed)
Patient ID: Liliya Fullenwider, female   DOB: 03/03/1971, 47 y.o.   MRN: [id number] DAR Note: Pt observed in dayroom interacting with peers. Pt at the time of assessment endorsed moderate anxiety; "my main issue was accommodation and I think we are getting somewhere with that." Pt denied pain, depression, SI/HI or AVH. Medications offered as prescribed. All patient's questions and concerns addressed. Support, encouragement, and safe environment provided. Will continue to monitor for any changes. 15-minute safety checks continue. Pt attended wrap-up group. Pt had no schedule medications.

## 2017-09-09 NOTE — Plan of Care (Signed)
  Problem: Activity: Goal: Interest or engagement in activities will improve Outcome: Progressing   Problem: Safety: Goal: Periods of time without injury will increase Outcome: Progressing  DAR NOTE: Patient presents with calm affect and mood.  Denies suicidal thoughts, auditory and visual hallucinations.  Rates depression at 0, hopelessness at 0, and anxiety at 3.  Maintained on routine safety checks.  Medications given as prescribed.  Support and encouragement offered as needed.  Attended group and participated.  States goal for today is "discharge."  Patient observed socializing with peers in the dayroom.  Offered no complaint.

## 2017-09-09 NOTE — [facility] Group Notes (Signed)
Adult Psychoeducational Group Note  Date:  09/09/2017 Time:  10:42 PM  Group Topic/Focus:  Wrap-Up Group:   The focus of this group is to help patients review their daily goal of treatment and discuss progress on daily workbooks.  Participation Level:  Minimal  Participation Quality:  Appropriate and Attentive  Affect:  Tearful  Cognitive:  Alert and Appropriate  Insight: Appropriate and Good  Engagement in Group:  Supportive  Modes of Intervention:  Discussion  Additional Comments:  Pt attended group this evening. Pt was very tearful during group and reflected on the passing of their father.   Chrisandra Netters 09/09/2017, 10:42 PM

## 2017-09-10 DIAGNOSIS — Z91419 Personal history of unspecified adult abuse: Secondary | ICD-10-CM

## 2017-09-10 MED ORDER — SERTRALINE HCL 100 MG PO TABS: 100.0000 mg | ORAL_TABLET | Freq: Every day | ORAL | 0 refills | Status: AC

## 2017-09-10 MED ORDER — CHLORTHALIDONE 25 MG PO TABS: 25.0000 mg | ORAL_TABLET | Freq: Every day | ORAL | 0 refills | Status: DC

## 2017-09-10 MED ORDER — CHLORTHALIDONE 25 MG PO TABS
25.0000 mg | ORAL_TABLET | Freq: Every day | ORAL | Status: DC
Start: 2017-09-11 — End: 2017-09-10
  Filled 2017-09-10 (×2): qty 7

## 2017-09-10 MED ORDER — GABAPENTIN 100 MG PO CAPS: 200.0000 mg | ORAL_CAPSULE | Freq: Three times a day (TID) | ORAL | 0 refills | Status: AC

## 2017-09-10 MED ORDER — AMLODIPINE BESYLATE 5 MG PO TABS
5.0000 mg | ORAL_TABLET | Freq: Every day | ORAL | Status: DC
Start: 2017-09-11 — End: 2017-09-10
  Filled 2017-09-10 (×2): qty 7

## 2017-09-10 MED ORDER — AMLODIPINE BESYLATE 5 MG PO TABS: 5.0000 mg | ORAL_TABLET | Freq: Every day | ORAL | 0 refills | Status: DC

## 2017-09-10 MED ORDER — TRAZODONE HCL 100 MG PO TABS: 100.0000 mg | ORAL_TABLET | Freq: Every evening | ORAL | 0 refills | Status: AC | PRN

## 2017-09-10 MED ORDER — HYDROXYZINE HCL 25 MG PO TABS: 25.0000 mg | ORAL_TABLET | Freq: Three times a day (TID) | ORAL | 0 refills | Status: AC | PRN

## 2017-09-10 NOTE — [facility] Suicide Risk Assessment (Signed)
[facility] INPATIENT:  Family/Significant Other Suicide Prevention Education  Suicide Prevention Education:   SPE completed with patient, as patient refused to consent to family contact. SPI pamphlet provided to pt and pt was encouraged to share information with support network, ask questions, and talk about any concerns relating to SPE. Patient denies access to guns/firearms and verbalized understanding of information provided. Mobile Crisis information also provided to patient.   

## 2017-09-10 NOTE — Progress Notes (Signed)
Pt received both written and verbal discharge instructions. Pt verbalized understanding of discharge instructions. Pt agreed to f/u appt and med regimen. Pt received d/c packet, prescriptions and sample meds. Pt gathered belongings from room and locker. Pt safely discharged to the lobby.

## 2017-09-10 NOTE — Progress Notes (Signed)
Adult Psychoeducational Group Note  Date:  09/10/2017 Time:  9:38 AM  Group Topic/Focus:  Goals Group:   The focus of this group is to help patients establish daily goals to achieve during treatment and discuss how the patient can incorporate goal setting into their daily lives to aide in recovery.  Participation Level:  Active  Participation Quality:  Appropriate  Affect:  Appropriate  Cognitive:  Appropriate  Insight: Appropriate  Engagement in Group:  Engaged  Modes of Intervention:  Discussion  Additional Comments:  Pt was telling the new pts to ask questions and do research on their own about their meds and diagnosis. Her goal  Today was to get D/C on time.   Deforest [employer]oyles Tiara Bartoli 09/10/2017, 9:38 AM
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
## 2017-09-10 NOTE — BHH Suicide Risk Assessment (Addendum)
[facility] Discharge Suicide Risk Assessment   Principal Problem: MDD (major depressive disorder), recurrent episode, severe ([facility]) Discharge Diagnoses:  Patient Active Problem List   Diagnosis Date Noted  . MDD (major depressive disorder), recurrent episode, severe ([facility]) [F33.2] 09/05/2017    Total Time spent with patient: 30 minutes  Musculoskeletal: Strength & Muscle Tone: within normal limits Gait & Station: normal Patient leans: N/A  Psychiatric Specialty Exam: ROS no headache, no chest pain, no shortness of breath, no vomiting   Blood pressure (!) 170/104, pulse 92, temperature 98.7 F (37.1 C), temperature source Oral, resp. rate 16, height  (1.651 m), weight 98.9 kg (218 lb), last menstrual period 10/11/2015.Body mass index is 36.28 kg/m.   General Appearance: Well Groomed  Eye Contact::  Good  Speech:  Normal Rate409  Volume:  Normal  Mood:  reports that in general her mood has improved, but saddened by recent news of her father passing   Affect:  Appropriate and slightly anxious   Thought Process:  Linear and Descriptions of Associations: Intact  Orientation:  Full (Time, Place, and Person)  Thought Content:  no hallucinations, no delusions , not internally preoccupied   Suicidal Thoughts:  No denies suicidal ideations, no self injurious ideations, no homicidal or violent ideations  Homicidal Thoughts:  No  Memory:  recent and remote grossly intact   Judgement:  Other:  improving   Insight:  improving   Psychomotor Activity:  Normal  Concentration:  Good  Recall:  Good  Fund of Knowledge:Good  Language: Good  Akathisia:  Negative  Handed:  Right  AIMS (if indicated):     Assets:  Communication Skills Desire for Improvement Resilience  Sleep:  Number of Hours: 4  Cognition: WNL  ADL's:  Intact   Mental Status Per Nursing Assessment::   On Admission:     Demographic Factors:  46 year old female, has four adult children , homeless   Loss Factors: Family  stressors , homelessness   Historical Factors: History of depression, one prior psychiatric admission in 2003  Risk Reduction Factors:   Sense of responsibility to family and Positive coping skills or problem solving skills  Continued Clinical Symptoms:  At this time patient is alert, attentive, well related, reports mood has improved compared to admission, affect is appropriate, reactive, briefly tearful when speaking about father's passing , no thought disorder, no suicidal or self injurious ideations, no homicidal or violent ideations, no psychotic symptoms, future oriented . Patient found out her father passed away in [place] yesterday, and her focus now is to travel there to help organize and attend wake, funeral, clean up his apartment. States she will be travelling there today with a sister . States she plans to return to [place] in a week.  Denies medication side effects.  Of note, patient's BP has remained elevated- she does not have associated symptoms. She does have history of HTN. BUN10, Creatinine 0.93. Reports she had been on Norvasc in the past, but had not been taking prior to admission. Denies side effects. I consulted hospitalist service, recommendation is to stop Lisinopril , and start [place] 25 mgrs QDAY and Norvasc 5 mgrs QDAY . I have reviewed with patient, who agrees .  Behavior on unit in good control .  Cognitive Features That Contribute To Risk:  Closed-mindedness and Loss of executive function    Suicide Risk:  Mild:  Suicidal ideation of limited frequency, intensity, duration, and specificity.  There are no identifiable plans, no associated intent,  mild dysphoria and related symptoms, good self-control (both objective and subjective assessment), few other risk factors, and identifiable protective factors, including available and accessible social support.  Follow-up Information    [facility]. Go on 09/11/2017.   Specialty:  Behavioral Health Why:  Appointment is on Thursday,  09/11/17 at 8:45am.  Contact information: [address] [place] [address] [phone]           Plan Of Care/Follow-up recommendations:  Activity:  as tolerated  Diet:  heart healthy Tests:  NA Other:  See below  Patient is requesting discharge and there are no current grounds for involuntary commitment Plans to be staying with family in [facility] for a week, then return to [place], stay in a shelter- considering [facility]  Follow up as above . Referred to [facility] for medical management and support   Craige Cotta, MD 09/10/2017, 7:42 AM

## 2017-09-10 NOTE — Progress Notes (Signed)
  [facility] Adult Case Management Discharge Plan :  Will you be returning to the same living situation after discharge:  No. Patient is going to [place], [place] to be with family at discharge.  At discharge, do you have transportation home?: Yes,  patient reports her sister is picking her up at discharge Do you have the ability to pay for your medications: Yes,  Medicaid  Release of information consent forms completed and in the chart;  Patient's signature needed at discharge.  Patient to Follow up at: Follow-up Information    [facility]. Go on 09/11/2017.   Specialty:  Behavioral Health Why:  Appointment is on Thursday, 09/11/17 at 8:45am.  Contact information: [address] [place] [address] [phone]           Next level of care provider has access to [facility] Link:yes  Safety Planning and Suicide Prevention discussed: Yes,  with the patient  Have you used any form of tobacco in the last 30 days? (Cigarettes, Smokeless Tobacco, Cigars, and/or Pipes): Yes  Has patient been referred to the Quitline?: Patient refused referral  Patient has been referred for addiction treatment: N/A  Maeola Sarah, LCSWA 09/10/2017, 9:57 AM

## 2017-09-10 NOTE — Discharge Instructions (Signed)
Living With Depression Everyone experiences occasional disappointment, sadness, and loss in their lives. When you are feeling down, blue, or sad for at least 2 weeks in a row, it may mean that you have depression. Depression can affect your thoughts and feelings, relationships, daily activities, and physical health. It is caused by changes in the way your brain functions. If you receive a diagnosis of depression, your health care provider will tell you which type of depression you have and what treatment options are available to you. If you are living with depression, there are ways to help you recover from it and also ways to prevent it from coming back. How to cope with lifestyle changes Coping with stress Stress is your body's reaction to life changes and events, both good and bad. Stressful situations may include:  Getting married.  The death of a spouse.  Losing a job.  Retiring.  Having a baby.  Stress can last just a few hours or it can be ongoing. Stress can play a major role in depression, so it is important to learn both how to cope with stress and how to think about it differently. Talk with your health care provider or a counselor if you would like to learn more about stress reduction. He or she may suggest some stress reduction techniques, such as:  Music therapy. This can include creating music or listening to music. Choose music that you enjoy and that inspires you.  Mindfulness-based meditation. This kind of meditation can be done while sitting or walking. It involves being aware of your normal breaths, rather than trying to control your breathing.  Centering prayer. This is a kind of meditation that involves focusing on a spiritual word or phrase. Choose a word, phrase, or sacred image that is meaningful to you and that brings you peace.  Deep breathing. To do this, expand your stomach and inhale slowly through your nose. Hold your breath for 3-5 seconds, then exhale  slowly, allowing your stomach muscles to relax.  Muscle relaxation. This involves intentionally tensing muscles then relaxing them.  Choose a stress reduction technique that fits your lifestyle and personality. Stress reduction techniques take time and practice to develop. Set aside 5-15 minutes a day to do them. Therapists can offer training in these techniques. The training may be covered by some insurance plans. Other things you can do to manage stress include:  Keeping a stress diary. This can help you learn what triggers your stress and ways to control your response.  Understanding what your limits are and saying no to requests or events that lead to a schedule that is too full.  Thinking about how you respond to certain situations. You may not be able to control everything, but you can control how you react.  Adding humor to your life by watching funny films or TV shows.  Making time for activities that help you relax and not feeling guilty about spending your time this way.  Medicines Your health care provider may suggest certain medicines if he or she feels that they will help improve your condition. Avoid using alcohol and other substances that may prevent your medicines from working properly (may interact). It is also important to:  Talk with your pharmacist or health care provider about all the medicines that you take, their possible side effects, and what medicines are safe to take together.  Make it your goal to take part in all treatment decisions (shared decision-making). This includes giving input on the side   effects of medicines. It is best if shared decision-making with your health care provider is part of your total treatment plan.  If your health care provider prescribes a medicine, you may not notice the full benefits of it for 4-8 weeks. Most people who are treated for depression need to be on medicine for at least 6-12 months after they feel better. If you are taking  medicines as part of your treatment, do not stop taking medicines without first talking to your health care provider. You may need to have the medicine slowly decreased (tapered) over time to decrease the risk of harmful side effects. Relationships Your health care provider may suggest family therapy along with individual therapy and drug therapy. While there may not be family problems that are causing you to feel depressed, it is still important to make sure your family learns as much as they can about your mental health. Having your family's support can help make your treatment successful. How to recognize changes in your condition Everyone has a different response to treatment for depression. Recovery from major depression happens when you have not had signs of major depression for two months. This may mean that you will start to:  Have more interest in doing activities.  Feel less hopeless than you did 2 months ago.  Have more energy.  Overeat less often, or have better or improving appetite.  Have better concentration.  Your health care provider will work with you to decide the next steps in your recovery. It is also important to recognize when your condition is getting worse. Watch for these signs:  Having fatigue or low energy.  Eating too much or too little.  Sleeping too much or too little.  Feeling restless, agitated, or hopeless.  Having trouble concentrating or making decisions.  Having unexplained physical complaints.  Feeling irritable, angry, or aggressive.  Get help as soon as you or your family members notice these symptoms coming back. How to get support and help from others How to talk with friends and family members about your condition Talking to friends and family members about your condition can provide you with one way to get support and guidance. Reach out to trusted friends or family members, explain your symptoms to them, and let them know that you are  working with a health care provider to treat your depression. Financial resources Not all insurance plans cover mental health care, so it is important to check with your insurance carrier. If paying for co-pays or counseling services is a problem, search for a local or county mental health care center. They may be able to offer public mental health care services at low or no cost when you are not able to see a private health care provider. If you are taking medicine for depression, you may be able to get the generic form, which may be less expensive. Some makers of prescription medicines also offer help to patients who cannot afford the medicines they need. Follow these instructions at home:  Get the right amount and quality of sleep.  Cut down on using caffeine, tobacco, alcohol, and other potentially harmful substances.  Try to exercise, such as walking or lifting small weights.  Take over-the-counter and prescription medicines only as told by your health care provider.  Eat a healthy diet that includes plenty of vegetables, fruits, whole grains, low-fat dairy products, and lean protein. Do not eat a lot of foods that are high in solid fats, added sugars, or salt.    Keep all follow-up visits as told by your health care provider. This is important. Contact a health care provider if:  You stop taking your antidepressant medicines, and you have any of these symptoms: ? Nausea. ? Headache. ? Feeling lightheaded. ? Chills and body aches. ? Not being able to sleep (insomnia).  You or your friends and family think your depression is getting worse. Get help right away if:  You have thoughts of hurting yourself or others. If you ever feel like you may hurt yourself or others, or have thoughts about taking your own life, get help right away. You can go to your nearest emergency department or call:  Your local emergency services (911 in the [place]  A suicide crisis helpline, such as the  National Suicide Prevention Lifeline at 1-800-273-8255. This is open 24-hours a day.  Summary  If you are living with depression, there are ways to help you recover from it and also ways to prevent it from coming back.  Work with your health care team to create a management plan that includes counseling, stress management techniques, and healthy lifestyle habits. This information is not intended to replace advice given to you by your health care provider. Make sure you discuss any questions you have with your health care provider. Document Released: 03/18/2016 Document Revised: 03/18/2016 Document Reviewed: 03/18/2016 Elsevier Interactive Patient Education  2018 [employer]  

## 2017-09-10 NOTE — Progress Notes (Signed)
Recreation Therapy Notes  Date: 5.15.19 Time: 0930 Location: [address] Dayroom  Group Topic: Stress Management  Goal Area(s) Addresses:  Patient will verbalize importance of using healthy stress management.  Patient will identify positive emotions associated with healthy stress management.   Behavioral Response: Engaged  Intervention: Stress Management  Activity :  Meditation.  LRT played a meditation on being resilient in the face of adversity.  Patients were to follow along as the meditation played.   Education:  Stress Management, Discharge Planning.   Education Outcome: Acknowledges edcuation/In group clarification offered/Needs additional education  Clinical Observations/Feedback: Pt attended and participated in group.    Caroll Rancher, LRT/CTRS         Lillia Abed, Nachum Derossett A 09/10/2017 11:03 AM

## 2017-12-14 ENCOUNTER — Emergency Department (HOSPITAL_COMMUNITY)
Admission: EM | Admit: 2017-12-14 | Discharge: 2017-12-14 | Disposition: A | Discharge status: Home / Self Care | Payer: Self-pay | Attending: Emergency Medicine | Admitting: Emergency Medicine

## 2017-12-14 ENCOUNTER — Other Ambulatory Visit: Payer: Self-pay

## 2017-12-14 ENCOUNTER — Encounter (HOSPITAL_COMMUNITY): Payer: Self-pay | Admitting: Emergency Medicine

## 2017-12-14 DIAGNOSIS — M79661 Pain in right lower leg: Secondary | ICD-10-CM | POA: Insufficient documentation

## 2017-12-14 DIAGNOSIS — M7989 Other specified soft tissue disorders: Secondary | ICD-10-CM

## 2017-12-14 DIAGNOSIS — R2241 Localized swelling, mass and lump, right lower limb: Secondary | ICD-10-CM | POA: Insufficient documentation

## 2017-12-14 DIAGNOSIS — F17210 Nicotine dependence, cigarettes, uncomplicated: Secondary | ICD-10-CM | POA: Insufficient documentation

## 2017-12-14 DIAGNOSIS — I10 Essential (primary) hypertension: Secondary | ICD-10-CM | POA: Insufficient documentation

## 2017-12-14 MED ORDER — CHLORTHALIDONE 25 MG PO TABS: 25.0000 mg | ORAL_TABLET | Freq: Every day | ORAL | 0 refills | Status: AC

## 2017-12-14 MED ORDER — AMLODIPINE BESYLATE 5 MG PO TABS: 5.0000 mg | ORAL_TABLET | Freq: Every day | ORAL | 0 refills | Status: AC

## 2017-12-14 MED ORDER — ENOXAPARIN SODIUM 300 MG/3ML IJ SOLN
1.5000 mg/kg | Freq: Once | INTRAMUSCULAR | Status: AC
Start: 2017-12-14 — End: 2017-12-14
  Administered 2017-12-14: 155 mg via SUBCUTANEOUS
  Filled 2017-12-14: qty 1.55

## 2017-12-14 NOTE — Discharge Instructions (Signed)
Follow the instructions to get an ultrasound tomorrow to make sure you do not have a blood clot.  If the ultrasound is negative, you do not have a blood clot, make sure you are wearing compression socks to help with leg swelling.  Take blood pressure medication as prescribed.  Follow-up with [facility] wellness for further evaluation of your swelling. If the ultrasound is positive, you do have a blood clot, you will be sent back to the emergency room for further treatment. Either way, I encourage you to follow-up with [facility] and wellness for management of your blood pressure. Return to the emergency room if you develop severe chest pain, shortness of breath, or any new or concerning symptoms.

## 2017-12-14 NOTE — ED Triage Notes (Signed)
C/o pain and swelling to top of R foot, R ankle, and R calf x 2-3 days.  Denies injury.  States she has been working and on her feet a lot.

## 2017-12-14 NOTE — ED Notes (Signed)
Patient able to ambulate independently  

## 2017-12-15 ENCOUNTER — Ambulatory Visit (HOSPITAL_COMMUNITY)
Admission: RE | Admit: 2017-12-15 | Discharge: 2017-12-15 | Disposition: A | Discharge status: Home / Self Care | Payer: Self-pay | Source: Ambulatory Visit | Attending: Student | Admitting: Student

## 2017-12-15 DIAGNOSIS — M79609 Pain in unspecified limb: Secondary | ICD-10-CM

## 2017-12-15 DIAGNOSIS — M79604 Pain in right leg: Secondary | ICD-10-CM | POA: Insufficient documentation

## 2017-12-15 DIAGNOSIS — M7989 Other specified soft tissue disorders: Secondary | ICD-10-CM | POA: Insufficient documentation

## 2017-12-15 NOTE — Progress Notes (Signed)
Right lower extremity venous duplex has been completed. Negative for DVT.  12/15/17 10:01 AM Olen CordialGreg Jun Osment RVT

## 2017-12-15 NOTE — ED Provider Notes (Signed)
[facility]   CSN: [id number] Arrival date & time: 12/14/17  2112     History   Chief Complaint Chief Complaint  Patient presents with  . Ankle Pain  . Foot Pain    HPI Brandy Holmes is a 47 y.o. female presented for evaluation of right leg pain and swelling.   Patient states for the past 3 days, she has been having increasing right leg swelling and pain.  She states that she has a history of hypertension for which she supposed be taking medication, but she has not been on it since March.  Additionally, she has been eating a lot of salty foods and spending a lot of time on her feet.  She reports her swelling is improved when she first wakes up in the morning.  She reports mild swelling of the left foot, but significantly more swelling of the right foot.  She reports increasing pain and irritation of her calf.  She denies fevers, chills, chest pain, shortness of breath, nausea, vomiting, abdominal pain.  She denies recent travel, surgeries, immobilization, trauma, history of cancer, history of previous DVT/PE, or OCP use.  HPI  Past Medical History:  Diagnosis Date  . Arthritis   . Hypertension     Patient Active Problem List   Diagnosis Date Noted  . MDD (major depressive disorder), recurrent episode, severe (HCC) 09/05/2017    Past Surgical History:  Procedure Laterality Date  . ABDOMINAL HYSTERECTOMY    . BREAST SURGERY    . CHOLECYSTECTOMY       OB History   None      Home Medications    Prior to Admission medications   Medication Sig Start Date End Date Taking? Authorizing Provider  amLODipine (NORVASC) 5 MG tablet Take 1 tablet (5 mg total) by mouth daily for 14 days. 12/14/17 12/28/17  Kamyah Wilhelmsen, PA-C  chlorthalidone (HYGROTON) 25 MG tablet Take 1 tablet (25 mg total) by mouth daily for 14 days. 12/14/17 12/28/17  Dee Paden, PA-C  gabapentin (NEURONTIN) 100 MG capsule Take 2 capsules (200 mg  total) by mouth 3 (three) times daily. 09/10/17   Charm RingsLord, Jamison Y, NP  hydrOXYzine (ATARAX/VISTARIL) 25 MG tablet Take 1 tablet (25 mg total) by mouth 3 (three) times daily as needed for anxiety. 09/10/17   Charm RingsLord, Jamison Y, NP  sertraline (ZOLOFT) 100 MG tablet Take 1 tablet (100 mg total) by mouth daily. 09/11/17   Charm RingsLord, Jamison Y, NP  traZODone (DESYREL) 100 MG tablet Take 1 tablet (100 mg total) by mouth at bedtime as needed for sleep. 09/10/17   Charm RingsLord, Jamison Y, NP    Family History No family history on file.  Social History Social History   Tobacco Use  . Smoking status: Current Every Day Smoker    Packs/day: 0.50  . Smokeless tobacco: Never Used  Substance Use Topics  . Alcohol use: Yes    Alcohol/week: 3.0 - 4.0 standard drinks    Types: 3 - 4 Shots of liquor per week    Comment: ocassionally per Pt   . Drug use: Yes    Types: Marijuana     Allergies   Patient has no known allergies.   Review of Systems Review of Systems  Respiratory: Negative for shortness of breath.   Cardiovascular: Positive for leg swelling. Negative for chest pain.  Musculoskeletal: Positive for myalgias.  Hematological: Does not bruise/bleed easily.     Physical Exam Updated Vital Signs BP Marland Kitchen(!)  183/83 (BP Location: Right Arm)   Pulse 64   Temp 98.3 F (36.8 C) (Oral)   Resp 14   Ht 5\' 5"  (1.651 m)   Wt 103.3 kg   LMP 10/11/2015   SpO2 99%   BMI 37.91 kg/m   Physical Exam  Constitutional: She is oriented to person, place, and time. She appears well-developed and well-nourished. No distress.  Sitting comfortably in the bed in no acute distress  HENT:  Head: Normocephalic and atraumatic.  Eyes: Pupils are equal, round, and reactive to light. Conjunctivae and EOM are normal.  Neck: Normal range of motion. Neck supple.  Cardiovascular: Normal rate, regular rhythm and intact distal pulses.  Pulmonary/Chest: Effort normal and breath sounds normal. No respiratory distress. She has no  wheezes.  Speaking in full sentences.  Clear lung sounds in all fields.  Abdominal: Soft. She exhibits no distension. There is no tenderness.  Musculoskeletal: Normal range of motion. She exhibits edema and tenderness.  Obvious swelling of the right leg.  Tenderness palpation of the calf.  Pedal pulses intact bilaterally.  Good cap refill.  No erythema or warmth of the leg.  Minimal swelling noted of the left foot.  1+ pitting edema of the right lower leg, no pitting edema of the left.  Neurological: She is alert and oriented to person, place, and time. No sensory deficit.  Skin: Skin is warm and dry. Capillary refill takes less than 2 seconds.  Psychiatric: She has a normal mood and affect.  Nursing note and vitals reviewed.    ED Treatments / Results  Labs (all labs ordered are listed, but only abnormal results are displayed) Labs Reviewed - No data to display  EKG None  Radiology No results found.  Procedures Procedures (including critical care time)  Medications Ordered in ED Medications  enoxaparin (LOVENOX) injection 155 mg (155 mg Subcutaneous Given 12/14/17 2255)     Initial Impression / Assessment and Plan / ED Course  I have reviewed the triage vital signs and the nursing notes.  Pertinent labs & imaging results that were available during my care of the patient were reviewed by me and considered in my medical decision making (see chart for details).     Pt presenting for evaluation of right leg pain and swelling.  Physical exam concerning for possible DVT, as swelling is unilateral and pain extends up into the calf.  However, patient without risk factors for DVT.  Additionally, patient's blood pressure was elevated, states she is a history of high blood pressures but is not taking her medication.  Will give short course of her home medication and have patient follow-up with [facility] and wellness.  Patient without signs of endorgan damage, I do not believe imaging or  labs are necessary at this time, she is asymptomatic.  Will give dose of Lovenox tonight and have patient follow-up tomorrow for outpatient ultrasound to rule out DVT.  If negative, will treat for peripheral edema with compression socks, diet changes, and blood pressure control.  At this time, patient appears safe for discharge.  Return precautions given.  Patient states she understands and agrees to plan.    Final Clinical Impressions(s) / ED Diagnoses   Final diagnoses:  Pain and swelling of right lower leg    ED Discharge Orders         Ordered    amLODipine (NORVASC) 5 MG tablet  Daily     12/14/17 2215    chlorthalidone (HYGROTON) 25 MG tablet  Daily     12/14/17 2215    LE VENOUS     12/14/17 2216           Alveria ApleyCaccavale, Larrie Fraizer, PA-C 12/15/17 0041    Tegeler, Canary Brimhristopher J, MD 12/15/17 1141

## 2018-02-18 ENCOUNTER — Ambulatory Visit (INDEPENDENT_AMBULATORY_CARE_PROVIDER_SITE_OTHER): Payer: Self-pay | Admitting: Physician Assistant
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
